# Patient Record
Sex: Male | Born: 1940
Health system: Southern US, Community
[De-identification: ages and names within clinical notes are randomized; demographics above are authoritative.]

## PROBLEM LIST (undated history)

## (undated) DIAGNOSIS — E785 Hyperlipidemia, unspecified: Secondary | ICD-10-CM

## (undated) DIAGNOSIS — C801 Malignant (primary) neoplasm, unspecified: Secondary | ICD-10-CM

## (undated) DIAGNOSIS — C61 Malignant neoplasm of prostate: Secondary | ICD-10-CM

## (undated) DIAGNOSIS — E119 Type 2 diabetes mellitus without complications: Secondary | ICD-10-CM

## (undated) HISTORY — DX: Malignant neoplasm of prostate: C61

## (undated) HISTORY — DX: Hyperlipidemia, unspecified: E78.5

## (undated) HISTORY — DX: Malignant (primary) neoplasm, unspecified: C80.1

## (undated) HISTORY — PX: ROTATOR CUFF REPAIR: SHX139

---

## 2004-02-14 ENCOUNTER — Ambulatory Visit (HOSPITAL_BASED_OUTPATIENT_CLINIC_OR_DEPARTMENT_OTHER): Admission: RE | Admit: 2004-02-14 | Discharge: 2004-02-14 | Payer: Self-pay | Admitting: Orthopedic Surgery

## 2004-02-14 ENCOUNTER — Ambulatory Visit (HOSPITAL_COMMUNITY): Admission: RE | Admit: 2004-02-14 | Discharge: 2004-02-14 | Payer: Self-pay | Admitting: Orthopedic Surgery

## 2012-03-06 DIAGNOSIS — M79609 Pain in unspecified limb: Secondary | ICD-10-CM | POA: Diagnosis not present

## 2012-03-10 DIAGNOSIS — I872 Venous insufficiency (chronic) (peripheral): Secondary | ICD-10-CM | POA: Diagnosis not present

## 2012-03-10 DIAGNOSIS — L0390 Cellulitis, unspecified: Secondary | ICD-10-CM | POA: Diagnosis not present

## 2012-03-10 DIAGNOSIS — L0291 Cutaneous abscess, unspecified: Secondary | ICD-10-CM | POA: Diagnosis not present

## 2013-01-13 DIAGNOSIS — L821 Other seborrheic keratosis: Secondary | ICD-10-CM | POA: Diagnosis not present

## 2013-01-13 DIAGNOSIS — L570 Actinic keratosis: Secondary | ICD-10-CM | POA: Diagnosis not present

## 2013-01-13 DIAGNOSIS — D235 Other benign neoplasm of skin of trunk: Secondary | ICD-10-CM | POA: Diagnosis not present

## 2013-04-15 ENCOUNTER — Encounter: Payer: Self-pay | Admitting: Family Medicine

## 2013-04-15 ENCOUNTER — Telehealth: Payer: Self-pay | Admitting: Family Medicine

## 2013-04-15 ENCOUNTER — Encounter (INDEPENDENT_AMBULATORY_CARE_PROVIDER_SITE_OTHER): Payer: Self-pay

## 2013-04-15 ENCOUNTER — Ambulatory Visit (INDEPENDENT_AMBULATORY_CARE_PROVIDER_SITE_OTHER): Payer: Medicare Other | Admitting: Family Medicine

## 2013-04-15 VITALS — HR 79 | Temp 98.4°F | Ht 69.0 in | Wt 190.8 lb

## 2013-04-15 DIAGNOSIS — M1990 Unspecified osteoarthritis, unspecified site: Secondary | ICD-10-CM

## 2013-04-15 NOTE — Progress Notes (Signed)
   Subjective:    Patient ID: Howard Ramos, male    DOB: [birth year], 72 y.o.   MRN: [id number]  HPI This 72 y.o. male presents for evaluation of needing DMV paper work filled out. He has no chronic medical problems except for some mild arthritis.  He has had A shoulder surgery 9 years ago.   Review of Systems No chest pain, SOB, HA, dizziness, vision change, N/V, diarrhea, constipation, dysuria, urinary urgency or frequency, myalgias, arthralgias or rash.     Objective:   Physical Exam  Vital signs noted  Well developed well nourished male.  HEENT - Head atraumatic Normocephalic                Eyes - PERRLA, Conjuctiva - clear Sclera- Clear EOMI                Ears - EAC's Wnl TM's Wnl Gross Hearing WNL                Nose - Nares patent                 Throat - oropharanx wnl Respiratory - Lungs CTA bilateral Cardiac - RRR S1 and S2 without murmur GI - Abdomen soft Nontender and bowel sounds active x 4 Extremities - No edema. Neuro - Grossly intact.      Assessment & Plan:  OA (osteoarthritis) Aleve otc and DMV paperwork filled out and no restrictions.  Deatra Canter FNP

## 2013-04-20 NOTE — Telephone Encounter (Signed)
Taken care of by Azalee Course

## 2013-04-20 NOTE — Telephone Encounter (Signed)
Need to know what papers he is talking about.

## 2013-08-20 DIAGNOSIS — H524 Presbyopia: Secondary | ICD-10-CM | POA: Diagnosis not present

## 2013-08-20 DIAGNOSIS — H5210 Myopia, unspecified eye: Secondary | ICD-10-CM | POA: Diagnosis not present

## 2013-08-20 DIAGNOSIS — H2510 Age-related nuclear cataract, unspecified eye: Secondary | ICD-10-CM | POA: Diagnosis not present

## 2013-08-20 DIAGNOSIS — H52229 Regular astigmatism, unspecified eye: Secondary | ICD-10-CM | POA: Diagnosis not present

## 2013-10-12 DIAGNOSIS — H40059 Ocular hypertension, unspecified eye: Secondary | ICD-10-CM | POA: Diagnosis not present

## 2013-10-12 DIAGNOSIS — H18519 Endothelial corneal dystrophy, unspecified eye: Secondary | ICD-10-CM | POA: Diagnosis not present

## 2013-10-12 DIAGNOSIS — H35379 Puckering of macula, unspecified eye: Secondary | ICD-10-CM | POA: Diagnosis not present

## 2013-10-12 DIAGNOSIS — H2510 Age-related nuclear cataract, unspecified eye: Secondary | ICD-10-CM | POA: Diagnosis not present

## 2013-10-13 ENCOUNTER — Encounter (HOSPITAL_COMMUNITY): Payer: Self-pay | Admitting: Pharmacy Technician

## 2013-10-19 DIAGNOSIS — H2510 Age-related nuclear cataract, unspecified eye: Secondary | ICD-10-CM | POA: Diagnosis not present

## 2013-10-23 ENCOUNTER — Encounter (HOSPITAL_COMMUNITY)
Admission: RE | Admit: 2013-10-23 | Discharge: 2013-10-23 | Disposition: A | Discharge status: Home / Self Care | Payer: Medicare Other | Source: Ambulatory Visit | Attending: Ophthalmology | Admitting: Ophthalmology

## 2013-10-23 ENCOUNTER — Encounter (HOSPITAL_COMMUNITY): Payer: Self-pay

## 2013-10-23 ENCOUNTER — Other Ambulatory Visit: Payer: Self-pay

## 2013-10-23 DIAGNOSIS — Z01812 Encounter for preprocedural laboratory examination: Secondary | ICD-10-CM | POA: Diagnosis not present

## 2013-10-23 DIAGNOSIS — Z01810 Encounter for preprocedural cardiovascular examination: Secondary | ICD-10-CM | POA: Insufficient documentation

## 2013-10-23 HISTORY — DX: Type 2 diabetes mellitus without complications: E11.9

## 2013-10-23 LAB — BASIC METABOLIC PANEL
BUN: 20 mg/dL (ref 6–23)
CALCIUM: 9.1 mg/dL (ref 8.4–10.5)
CO2: 26 mEq/L (ref 19–32)
CREATININE: 1.16 mg/dL (ref 0.50–1.35)
Chloride: 104 mEq/L (ref 96–112)
GFR calc Af Amer: 71 mL/min — ABNORMAL LOW (ref >90)
GFR calc non Af Amer: 61 mL/min — ABNORMAL LOW (ref >90)
Glucose, Bld: 78 mg/dL (ref 70–99)
Potassium: 4.5 mEq/L (ref 3.7–5.3)
Sodium: 141 mEq/L (ref 137–147)

## 2013-10-23 LAB — HEMOGLOBIN AND HEMATOCRIT, BLOOD
HEMATOCRIT: 43.1 % (ref 39.0–52.0)
Hemoglobin: 14.5 g/dL (ref 13.0–17.0)

## 2013-10-23 NOTE — Patient Instructions (Signed)
Howard Ramos  10/23/2013   Your procedure is scheduled on:  10/29/13  Report to [facility] at 1200 PM.  Call this number if you have problems the morning of surgery: [phone]   Remember:   Do not eat food or drink liquids after midnight.   Take these medicines the morning of surgery with A SIP OF WATER: none   Do not wear jewelry, make-up or nail polish.  Do not wear lotions, powders, or perfumes. You may wear deodorant.  Do not shave 48 hours prior to surgery. Men may shave face and neck.  Do not bring valuables to the hospital.  [facility] is not responsible                  for any belongings or valuables.               Contacts, dentures or bridgework may not be worn into surgery.  Leave suitcase in the car. After surgery it may be brought to your room.  For patients admitted to the hospital, discharge time is determined by your                treatment team.               Patients discharged the day of surgery will not be allowed to drive  home.  Name and phone number of your driver: family  Special Instructions: N/A   Please read over the following fact sheets that you were given: Anesthesia Post-op Instructions and Care and Recovery After Surgery   PATIENT INSTRUCTIONS POST-ANESTHESIA  IMMEDIATELY FOLLOWING SURGERY:  Do not drive or operate machinery for the first twenty four hours after surgery.  Do not make any important decisions for twenty four hours after surgery or while taking narcotic pain medications or sedatives.  If you develop intractable nausea and vomiting or a severe headache please notify your doctor immediately.  FOLLOW-UP:  Please make an appointment with your surgeon as instructed. You do not need to follow up with anesthesia unless specifically instructed to do so.  WOUND CARE INSTRUCTIONS (if applicable):  Keep a dry clean dressing on the anesthesia/puncture wound site if there is drainage.  Once the wound has quit draining you may leave it open to air.   Generally you should leave the bandage intact for twenty four hours unless there is drainage.  If the epidural site drains for more than 36-48 hours please call the anesthesia department.  QUESTIONS?:  Please feel free to call your physician or the hospital operator if you have any questions, and they will be happy to assist you.      Cataract Surgery  A cataract is a clouding of the lens of the eye. When a lens becomes cloudy, vision is reduced based on the degree and nature of the clouding. Surgery may be needed to improve vision. Surgery removes the cloudy lens and usually replaces it with a substitute lens (intraocular lens, IOL). LET YOUR EYE DOCTOR KNOW ABOUT:  Allergies to food or medicine.  Medicines taken including herbs, eyedrops, over-the-counter medicines, and creams.  Use of steroids (by mouth or creams).  Previous problems with anesthetics or numbing medicine.  History of bleeding problems or blood clots.  Previous surgery.  Other health problems, including diabetes and kidney problems.  Possibility of pregnancy, if this applies. RISKS AND COMPLICATIONS  Infection.  Inflammation of the eyeball (endophthalmitis) that can spread to both eyes (sympathetic ophthalmia).  Poor wound healing.  If an IOL is inserted, it can later fall out of proper position. This is very uncommon.  Clouding of the part of your eye that holds an IOL in place. This is called an "after-cataract." These are uncommon, but easily treated. BEFORE THE PROCEDURE  Do not eat or drink anything except small amounts of water for 8 to 12 before your surgery, or as directed by your caregiver.  Unless you are told otherwise, continue any eyedrops you have been prescribed.  Talk to your primary caregiver about all other medicines that you take (both prescription and non-prescription). In some cases, you may need to stop or change medicines near the time of your surgery. This is most important if you  are taking blood-thinning medicine.Do not stop medicines unless you are told to do so.  Arrange for someone to drive you to and from the procedure.  Do not put contact lenses in either eye on the day of your surgery. PROCEDURE There is more than one method for safely removing a cataract. Your doctor can explain the differences and help determine which is best for you. Phacoemulsification surgery is the most common form of cataract surgery.  An injection is given behind the eye or eyedrops are given to make this a painless procedure.  A small cut (incision) is made on the edge of the clear, dome-shaped surface that covers the front of the eye (cornea).  A tiny probe is painlessly inserted into the eye. This device gives off ultrasound waves that soften and break up the cloudy center of the lens. This makes it easier for the cloudy lens to be removed by suction.  An IOL may be implanted.  The normal lens of the eye is covered by a clear capsule. Part of that capsule is intentionally left in the eye to support the IOL.  Your surgeon may or may not use stitches to close the incision. There are other forms of cataract surgery that require a larger incision and stiches to close the eye. This approach is taken in cases where the doctor feels that the cataract cannot be easily removed using phacoemulsification. AFTER THE PROCEDURE  When an IOL is implanted, it does not need care. It becomes a permanent part of your eye and cannot be seen or felt.  Your doctor will schedule follow-up exams to check on your progress.  Review your other medicines with your doctor to see which can be resumed after surgery.  Use eyedrops or take medicine as prescribed by your doctor. Document Released: 04/05/2011 Document Revised: 07/09/2011 Document Reviewed: 04/05/2011 [facility] Patient Information 2015 [place], [place]. This information is not intended to replace advice given to you by your health care provider.  Make sure you discuss any questions you have with your health care provider.

## 2013-10-23 NOTE — Pre-Procedure Instructions (Signed)
Patient given information to sign up for my chart at home. 

## 2013-10-29 ENCOUNTER — Encounter (HOSPITAL_COMMUNITY): Payer: Medicare Other | Admitting: Anesthesiology

## 2013-10-29 ENCOUNTER — Encounter (HOSPITAL_COMMUNITY)
Admission: RE | Disposition: A | Discharge status: Home / Self Care | Payer: Self-pay | Source: Ambulatory Visit | Attending: Ophthalmology

## 2013-10-29 ENCOUNTER — Ambulatory Visit (HOSPITAL_COMMUNITY)
Admission: RE | Admit: 2013-10-29 | Discharge: 2013-10-29 | Disposition: A | Discharge status: Home / Self Care | Payer: Medicare Other | Source: Ambulatory Visit | Attending: Ophthalmology | Admitting: Ophthalmology

## 2013-10-29 ENCOUNTER — Encounter (HOSPITAL_COMMUNITY): Payer: Self-pay | Admitting: *Deleted

## 2013-10-29 ENCOUNTER — Ambulatory Visit (HOSPITAL_COMMUNITY): Payer: Medicare Other | Admitting: Anesthesiology

## 2013-10-29 DIAGNOSIS — H2510 Age-related nuclear cataract, unspecified eye: Secondary | ICD-10-CM | POA: Diagnosis not present

## 2013-10-29 DIAGNOSIS — Z7982 Long term (current) use of aspirin: Secondary | ICD-10-CM | POA: Diagnosis not present

## 2013-10-29 DIAGNOSIS — H269 Unspecified cataract: Secondary | ICD-10-CM | POA: Insufficient documentation

## 2013-10-29 DIAGNOSIS — E119 Type 2 diabetes mellitus without complications: Secondary | ICD-10-CM | POA: Insufficient documentation

## 2013-10-29 DIAGNOSIS — H259 Unspecified age-related cataract: Secondary | ICD-10-CM | POA: Diagnosis not present

## 2013-10-29 DIAGNOSIS — Z87891 Personal history of nicotine dependence: Secondary | ICD-10-CM | POA: Diagnosis not present

## 2013-10-29 HISTORY — PX: CATARACT EXTRACTION W/PHACO: SHX586

## 2013-10-29 LAB — GLUCOSE, CAPILLARY: Glucose-Capillary: 93 mg/dL (ref 70–99)

## 2013-10-29 SURGERY — PHACOEMULSIFICATION, CATARACT, WITH IOL INSERTION
Anesthesia: Monitor Anesthesia Care | Site: Eye | Laterality: Left

## 2013-10-29 MED ORDER — TETRACAINE HCL 0.5 % OP SOLN
OPHTHALMIC | Status: AC
  Filled 2013-10-29: qty 2

## 2013-10-29 MED ORDER — FENTANYL CITRATE 0.05 MG/ML IJ SOLN: 25.0000 ug | INTRAMUSCULAR | Status: DC | PRN

## 2013-10-29 MED ORDER — ONDANSETRON HCL 4 MG/2ML IJ SOLN: 4.0000 mg | Freq: Once | INTRAMUSCULAR | Status: DC | PRN

## 2013-10-29 MED ORDER — MIDAZOLAM HCL 2 MG/2ML IJ SOLN
INTRAMUSCULAR | Status: AC
  Filled 2013-10-29: qty 2

## 2013-10-29 MED ORDER — PHENYLEPHRINE HCL 2.5 % OP SOLN
1.0000 [drp] | OPHTHALMIC | Status: AC
  Administered 2013-10-29 (×3): 1 [drp] via OPHTHALMIC

## 2013-10-29 MED ORDER — FENTANYL CITRATE 0.05 MG/ML IJ SOLN
25.0000 ug | INTRAMUSCULAR | Status: AC
  Administered 2013-10-29 (×2): 25 ug via INTRAVENOUS

## 2013-10-29 MED ORDER — LIDOCAINE 3.5 % OP GEL OPTIME - NO CHARGE
OPHTHALMIC | Status: DC | PRN
  Administered 2013-10-29: 2 [drp] via OPHTHALMIC

## 2013-10-29 MED ORDER — LIDOCAINE HCL (PF) 1 % IJ SOLN
INTRAMUSCULAR | Status: AC
  Filled 2013-10-29: qty 2

## 2013-10-29 MED ORDER — CYCLOPENTOLATE-PHENYLEPHRINE 0.2-1 % OP SOLN
1.0000 [drp] | OPHTHALMIC | Status: AC
  Administered 2013-10-29 (×3): 1 [drp] via OPHTHALMIC

## 2013-10-29 MED ORDER — EPINEPHRINE HCL 1 MG/ML IJ SOLN
INTRAOCULAR | Status: DC | PRN
  Administered 2013-10-29: 13:00:00

## 2013-10-29 MED ORDER — NEOMYCIN-POLYMYXIN-DEXAMETH 3.5-10000-0.1 OP SUSP
OPHTHALMIC | Status: DC | PRN
  Administered 2013-10-29: 2 [drp] via OPHTHALMIC

## 2013-10-29 MED ORDER — TETRACAINE HCL 0.5 % OP SOLN
1.0000 [drp] | OPHTHALMIC | Status: AC
  Administered 2013-10-29 (×3): 1 [drp] via OPHTHALMIC

## 2013-10-29 MED ORDER — LACTATED RINGERS IV SOLN
INTRAVENOUS | Status: DC
  Administered 2013-10-29: 13:00:00 via INTRAVENOUS

## 2013-10-29 MED ORDER — FENTANYL CITRATE 0.05 MG/ML IJ SOLN
INTRAMUSCULAR | Status: AC
  Filled 2013-10-29: qty 2

## 2013-10-29 MED ORDER — NEOMYCIN-POLYMYXIN-DEXAMETH 3.5-10000-0.1 OP SUSP
OPHTHALMIC | Status: AC
  Filled 2013-10-29: qty 5

## 2013-10-29 MED ORDER — MIDAZOLAM HCL 2 MG/2ML IJ SOLN
1.0000 mg | INTRAMUSCULAR | Status: DC | PRN
  Administered 2013-10-29: 2 mg via INTRAVENOUS

## 2013-10-29 MED ORDER — POVIDONE-IODINE 5 % OP SOLN
OPHTHALMIC | Status: DC | PRN
  Administered 2013-10-29: 1 via OPHTHALMIC

## 2013-10-29 MED ORDER — CYCLOPENTOLATE-PHENYLEPHRINE OP SOLN OPTIME - NO CHARGE
OPHTHALMIC | Status: AC
  Filled 2013-10-29: qty 2

## 2013-10-29 MED ORDER — LIDOCAINE HCL (PF) 1 % IJ SOLN
INTRAMUSCULAR | Status: DC | PRN
  Administered 2013-10-29: .4 mL

## 2013-10-29 MED ORDER — LIDOCAINE HCL 3.5 % OP GEL
OPHTHALMIC | Status: AC
  Filled 2013-10-29: qty 1

## 2013-10-29 MED ORDER — LIDOCAINE HCL 3.5 % OP GEL
1.0000 "application " | Freq: Once | OPHTHALMIC | Status: AC
  Administered 2013-10-29: 1 via OPHTHALMIC

## 2013-10-29 MED ORDER — NA HYALUR & NA CHOND-NA HYALUR 0.55-0.5 ML IO KIT
PACK | INTRAOCULAR | Status: DC | PRN
  Administered 2013-10-29: 1 via OPHTHALMIC

## 2013-10-29 MED ORDER — BSS IO SOLN
INTRAOCULAR | Status: DC | PRN
  Administered 2013-10-29: 15 mL via INTRAOCULAR

## 2013-10-29 MED ORDER — PHENYLEPHRINE HCL 2.5 % OP SOLN
OPHTHALMIC | Status: AC
  Filled 2013-10-29: qty 15

## 2013-10-29 SURGICAL SUPPLY — 33 items
CAPSULAR TENSION RING-AMO (OPHTHALMIC RELATED) IMPLANT
CLOTH BEACON ORANGE TIMEOUT ST (SAFETY) ×2 IMPLANT
EYE SHIELD UNIVERSAL CLEAR (GAUZE/BANDAGES/DRESSINGS) ×2 IMPLANT
GLOVE BIO SURGEON STRL SZ 6.5 (GLOVE) ×1 IMPLANT
GLOVE BIO SURGEONS STRL SZ 6.5 (GLOVE) ×1
GLOVE BIOGEL PI IND STRL 6.5 (GLOVE) IMPLANT
GLOVE BIOGEL PI IND STRL 7.0 (GLOVE) IMPLANT
GLOVE BIOGEL PI IND STRL 7.5 (GLOVE) IMPLANT
GLOVE BIOGEL PI INDICATOR 6.5 (GLOVE)
GLOVE BIOGEL PI INDICATOR 7.0 (GLOVE)
GLOVE BIOGEL PI INDICATOR 7.5 (GLOVE)
GLOVE ECLIPSE 6.5 STRL STRAW (GLOVE) IMPLANT
GLOVE ECLIPSE 7.0 STRL STRAW (GLOVE) IMPLANT
GLOVE ECLIPSE 7.5 STRL STRAW (GLOVE) IMPLANT
GLOVE EXAM NITRILE LRG STRL (GLOVE) IMPLANT
GLOVE EXAM NITRILE MD LF STRL (GLOVE) IMPLANT
GLOVE SKINSENSE NS SZ6.5 (GLOVE)
GLOVE SKINSENSE NS SZ7.0 (GLOVE) ×2
GLOVE SKINSENSE STRL SZ6.5 (GLOVE) IMPLANT
GLOVE SKINSENSE STRL SZ7.0 (GLOVE) IMPLANT
KIT VITRECTOMY (OPHTHALMIC RELATED) IMPLANT
PAD ARMBOARD 7.5X6 YLW CONV (MISCELLANEOUS) ×2 IMPLANT
PROC W NO LENS (INTRAOCULAR LENS)
PROC W SPEC LENS (INTRAOCULAR LENS)
PROCESS W NO LENS (INTRAOCULAR LENS) IMPLANT
PROCESS W SPEC LENS (INTRAOCULAR LENS) IMPLANT
RING MALYGIN (MISCELLANEOUS) IMPLANT
SIGHTPATH CAT PROC W REG LENS (Ophthalmic Related) ×3 IMPLANT
SYR TB 1ML LL NO SAFETY (SYRINGE) ×3 IMPLANT
TAPE SURG TRANSPORE 1 IN (GAUZE/BANDAGES/DRESSINGS) IMPLANT
TAPE SURGICAL TRANSPORE 1 IN (GAUZE/BANDAGES/DRESSINGS) ×2
VISCOELASTIC ADDITIONAL (OPHTHALMIC RELATED) IMPLANT
WATER STERILE IRR 250ML POUR (IV SOLUTION) ×2 IMPLANT

## 2013-10-29 NOTE — Transfer of Care (Signed)
Immediate Anesthesia Transfer of Care Note  Patient: Howard Ramos  Procedure(s) Performed: Procedure(s) with comments: CATARACT EXTRACTION PHACO AND INTRAOCULAR LENS PLACEMENT (IOC) (Left) - CDE:  15.52  Patient Location: Short Stay  Anesthesia Type:MAC  Level of Consciousness: awake, alert , oriented and patient cooperative  Airway & Oxygen Therapy: Patient Spontanous Breathing  Post-op Assessment: Report given to PACU RN, Post -op Vital signs reviewed and stable and Patient moving all extremities  Post vital signs: Reviewed and stable  Complications: No apparent anesthesia complications

## 2013-10-29 NOTE — Consult Note (Signed)
I have reviewed the H&P, the patient was re-examined, and I have identified no interval changes in medical condition and plan of care since the history and physical of record  

## 2013-10-29 NOTE — Anesthesia Procedure Notes (Signed)
Procedure Name: MAC Date/Time: 10/29/2013 1:09 PM Performed by: Vista Deck Pre-anesthesia Checklist: Patient identified, Emergency Drugs available, Suction available, Timeout performed and Patient being monitored Patient Re-evaluated:Patient Re-evaluated prior to inductionOxygen Delivery Method: Nasal Cannula

## 2013-10-29 NOTE — Anesthesia Preprocedure Evaluation (Signed)
Anesthesia Evaluation  Patient identified by MRN, date of birth, ID band Patient awake    Reviewed: Allergy & Precautions, H&P , NPO status , Patient's Chart, lab work & pertinent test results  Airway Mallampati: II TM Distance: >3 FB     Dental  (+) Teeth Intact   Pulmonary former smoker,  breath sounds clear to auscultation        Cardiovascular Rhythm:Regular Rate:Normal     Neuro/Psych    GI/Hepatic negative GI ROS,   Endo/Other  diabetes, Well Controlled, Type 2  Renal/GU      Musculoskeletal   Abdominal   Peds  Hematology   Anesthesia Other Findings   Reproductive/Obstetrics                           Anesthesia Physical Anesthesia Plan  ASA: II  Anesthesia Plan: MAC   Post-op Pain Management:    Induction: Intravenous  Airway Management Planned: Nasal Cannula  Additional Equipment:   Intra-op Plan:   Post-operative Plan:   Informed Consent: I have reviewed the patients History and Physical, chart, labs and discussed the procedure including the risks, benefits and alternatives for the proposed anesthesia with the patient or authorized representative who has indicated his/her understanding and acceptance.     Plan Discussed with:   Anesthesia Plan Comments:         Anesthesia Quick Evaluation

## 2013-10-29 NOTE — Op Note (Signed)
Date of Admission: 10/29/2013  Date of Surgery: 10/29/2013   Pre-Op Dx: Cataract Left Eye  Post-Op Dx: Cataract Left  Eye,  Dx Code 366.16  Surgeon: Tonny Branch, M.D.  Assistants: None  Anesthesia: Topical with MAC  Indications: Painless, progressive loss of vision with compromise of daily activities.  Surgery: Cataract Extraction with Intraocular lens Implant Left Eye  Discription: The patient had dilating drops and viscous lidocaine placed into the Left eye in the pre-op holding area. After transfer to the operating room, a time out was performed. The patient was then prepped and draped. Beginning with a 46 degree blade a paracentesis port was made at the surgeon's 2 o'clock position. The anterior chamber was then filled with 1% non-preserved lidocaine. This was followed by filling the anterior chamber with Provisc.  A 2.46mm keratome blade was used to make a clear corneal incision at the temporal limbus.  A bent cystatome needle was used to create a continuous tear capsulotomy. Hydrodissection was performed with balanced salt solution on a Fine canula. The lens nucleus was then removed using the phacoemulsification handpiece. Residual cortex was removed with the I&A handpiece. The anterior chamber and capsular bag were refilled with Provisc. A posterior chamber intraocular lens was placed into the capsular bag with it's injector. The implant was positioned with the Kuglan hook. The Provisc was then removed from the anterior chamber and capsular bag with the I&A handpiece. Stromal hydration of the main incision and paracentesis port was performed with BSS on a Fine canula. The wounds were tested for leak which was negative. The patient tolerated the procedure well. There were no operative complications. The patient was then transferred to the recovery room in stable condition.  Complications: None  Specimen: None  EBL: None  Prosthetic device: Hoya iSert 250, power 22.0 D, SN [id number].

## 2013-10-29 NOTE — Anesthesia Postprocedure Evaluation (Signed)
  Anesthesia Post-op Note  Patient: Howard Ramos  Procedure(s) Performed: Procedure(s) with comments: CATARACT EXTRACTION PHACO AND INTRAOCULAR LENS PLACEMENT (IOC) (Left) - CDE:  15.52  Patient Location: Short Stay  Anesthesia Type:MAC  Level of Consciousness: awake, alert , oriented and patient cooperative  Airway and Oxygen Therapy: Patient Spontanous Breathing  Post-op Pain: none  Post-op Assessment: Post-op Vital signs reviewed, Patient's Cardiovascular Status Stable, Respiratory Function Stable, Patent Airway and Pain level controlled  Post-op Vital Signs: Reviewed and stable  Last Vitals:  Filed Vitals:   10/29/13 1250  BP: 115/75  Temp:   Resp: 14    Complications: No apparent anesthesia complications

## 2013-11-02 ENCOUNTER — Encounter (HOSPITAL_COMMUNITY): Payer: Self-pay | Admitting: Ophthalmology

## 2013-11-02 DIAGNOSIS — H18519 Endothelial corneal dystrophy, unspecified eye: Secondary | ICD-10-CM | POA: Diagnosis not present

## 2013-11-02 DIAGNOSIS — H2510 Age-related nuclear cataract, unspecified eye: Secondary | ICD-10-CM | POA: Diagnosis not present

## 2013-11-18 ENCOUNTER — Other Ambulatory Visit (HOSPITAL_COMMUNITY): Payer: Medicare Other

## 2013-11-19 ENCOUNTER — Encounter (HOSPITAL_COMMUNITY): Payer: Self-pay | Admitting: Pharmacy Technician

## 2013-11-30 DIAGNOSIS — H2510 Age-related nuclear cataract, unspecified eye: Secondary | ICD-10-CM | POA: Diagnosis not present

## 2013-12-01 ENCOUNTER — Encounter (HOSPITAL_COMMUNITY)
Admission: RE | Admit: 2013-12-01 | Discharge: 2013-12-01 | Disposition: A | Discharge status: Home / Self Care | Payer: Medicare Other | Source: Ambulatory Visit | Attending: Ophthalmology | Admitting: Ophthalmology

## 2013-12-04 MED ORDER — LIDOCAINE HCL 3.5 % OP GEL
OPHTHALMIC | Status: AC
  Filled 2013-12-04: qty 1

## 2013-12-04 MED ORDER — NEOMYCIN-POLYMYXIN-DEXAMETH 3.5-10000-0.1 OP SUSP
OPHTHALMIC | Status: AC
  Filled 2013-12-04: qty 5

## 2013-12-04 MED ORDER — TETRACAINE HCL 0.5 % OP SOLN
OPHTHALMIC | Status: AC
  Filled 2013-12-04: qty 2

## 2013-12-04 MED ORDER — PHENYLEPHRINE HCL 2.5 % OP SOLN
OPHTHALMIC | Status: AC
  Filled 2013-12-04: qty 15

## 2013-12-04 MED ORDER — CYCLOPENTOLATE-PHENYLEPHRINE OP SOLN OPTIME - NO CHARGE
OPHTHALMIC | Status: AC
  Filled 2013-12-04: qty 2

## 2013-12-04 MED ORDER — LIDOCAINE HCL (PF) 1 % IJ SOLN
INTRAMUSCULAR | Status: AC
  Filled 2013-12-04: qty 2

## 2013-12-07 ENCOUNTER — Encounter (HOSPITAL_COMMUNITY): Payer: Self-pay | Admitting: *Deleted

## 2013-12-07 ENCOUNTER — Ambulatory Visit (HOSPITAL_COMMUNITY)
Admission: RE | Admit: 2013-12-07 | Discharge: 2013-12-07 | Disposition: A | Discharge status: Home / Self Care | Payer: Medicare Other | Source: Ambulatory Visit | Attending: Ophthalmology | Admitting: Ophthalmology

## 2013-12-07 ENCOUNTER — Encounter (HOSPITAL_COMMUNITY)
Admission: RE | Disposition: A | Discharge status: Home / Self Care | Payer: Self-pay | Source: Ambulatory Visit | Attending: Ophthalmology

## 2013-12-07 ENCOUNTER — Encounter (HOSPITAL_COMMUNITY): Payer: Medicare Other | Admitting: Anesthesiology

## 2013-12-07 ENCOUNTER — Ambulatory Visit (HOSPITAL_COMMUNITY): Payer: Medicare Other | Admitting: Anesthesiology

## 2013-12-07 DIAGNOSIS — H2510 Age-related nuclear cataract, unspecified eye: Secondary | ICD-10-CM | POA: Insufficient documentation

## 2013-12-07 DIAGNOSIS — H269 Unspecified cataract: Secondary | ICD-10-CM | POA: Diagnosis not present

## 2013-12-07 HISTORY — PX: CATARACT EXTRACTION W/PHACO: SHX586

## 2013-12-07 LAB — GLUCOSE, CAPILLARY: GLUCOSE-CAPILLARY: 112 mg/dL — AB (ref 70–99)

## 2013-12-07 SURGERY — PHACOEMULSIFICATION, CATARACT, WITH IOL INSERTION
Anesthesia: Monitor Anesthesia Care | Site: Eye | Laterality: Right

## 2013-12-07 MED ORDER — CYCLOPENTOLATE-PHENYLEPHRINE 0.2-1 % OP SOLN
1.0000 [drp] | OPHTHALMIC | Status: AC
  Administered 2013-12-07 (×3): 1 [drp] via OPHTHALMIC

## 2013-12-07 MED ORDER — FENTANYL CITRATE 0.05 MG/ML IJ SOLN
25.0000 ug | INTRAMUSCULAR | Status: AC
Start: 2013-12-07 — End: 2013-12-07
  Administered 2013-12-07 (×2): 25 ug via INTRAVENOUS

## 2013-12-07 MED ORDER — LACTATED RINGERS IV SOLN
INTRAVENOUS | Status: DC
  Administered 2013-12-07: 10:00:00 via INTRAVENOUS

## 2013-12-07 MED ORDER — MIDAZOLAM HCL 2 MG/2ML IJ SOLN
INTRAMUSCULAR | Status: AC
  Filled 2013-12-07: qty 2

## 2013-12-07 MED ORDER — EPINEPHRINE HCL 1 MG/ML IJ SOLN
INTRAOCULAR | Status: DC | PRN
  Administered 2013-12-07: 10:00:00

## 2013-12-07 MED ORDER — LIDOCAINE HCL 3.5 % OP GEL
1.0000 "application " | Freq: Once | OPHTHALMIC | Status: AC
  Administered 2013-12-07: 1 via OPHTHALMIC

## 2013-12-07 MED ORDER — POVIDONE-IODINE 5 % OP SOLN
OPHTHALMIC | Status: DC | PRN
  Administered 2013-12-07: 1 via OPHTHALMIC

## 2013-12-07 MED ORDER — MIDAZOLAM HCL 2 MG/2ML IJ SOLN
1.0000 mg | INTRAMUSCULAR | Status: DC | PRN
  Administered 2013-12-07: 2 mg via INTRAVENOUS

## 2013-12-07 MED ORDER — NEOMYCIN-POLYMYXIN-DEXAMETH 3.5-10000-0.1 OP SUSP
OPHTHALMIC | Status: DC | PRN
  Administered 2013-12-07: 2 [drp] via OPHTHALMIC

## 2013-12-07 MED ORDER — PHENYLEPHRINE HCL 2.5 % OP SOLN
1.0000 [drp] | OPHTHALMIC | Status: AC
  Administered 2013-12-07 (×3): 1 [drp] via OPHTHALMIC

## 2013-12-07 MED ORDER — NA HYALUR & NA CHOND-NA HYALUR 0.55-0.5 ML IO KIT
PACK | INTRAOCULAR | Status: DC | PRN
  Administered 2013-12-07: 1 via OPHTHALMIC

## 2013-12-07 MED ORDER — LIDOCAINE HCL (PF) 1 % IJ SOLN
INTRAMUSCULAR | Status: DC | PRN
  Administered 2013-12-07: .5 mL

## 2013-12-07 MED ORDER — EPINEPHRINE HCL 1 MG/ML IJ SOLN
INTRAMUSCULAR | Status: AC
  Filled 2013-12-07: qty 1

## 2013-12-07 MED ORDER — TETRACAINE HCL 0.5 % OP SOLN
1.0000 [drp] | OPHTHALMIC | Status: AC
  Administered 2013-12-07 (×3): 1 [drp] via OPHTHALMIC

## 2013-12-07 MED ORDER — FENTANYL CITRATE 0.05 MG/ML IJ SOLN
INTRAMUSCULAR | Status: AC
  Filled 2013-12-07: qty 2

## 2013-12-07 MED ORDER — BSS IO SOLN
INTRAOCULAR | Status: DC | PRN
  Administered 2013-12-07: 15 mL

## 2013-12-07 SURGICAL SUPPLY — 34 items
CAPSULAR TENSION RING-AMO (OPHTHALMIC RELATED) IMPLANT
CLOTH BEACON ORANGE TIMEOUT ST (SAFETY) ×3 IMPLANT
EYE SHIELD UNIVERSAL CLEAR (GAUZE/BANDAGES/DRESSINGS) ×2 IMPLANT
GLOVE BIO SURGEON STRL SZ 6.5 (GLOVE) IMPLANT
GLOVE BIO SURGEONS STRL SZ 6.5 (GLOVE)
GLOVE BIOGEL PI IND STRL 6.5 (GLOVE) IMPLANT
GLOVE BIOGEL PI IND STRL 7.0 (GLOVE) IMPLANT
GLOVE BIOGEL PI IND STRL 7.5 (GLOVE) IMPLANT
GLOVE BIOGEL PI INDICATOR 6.5 (GLOVE) ×2
GLOVE BIOGEL PI INDICATOR 7.0 (GLOVE)
GLOVE BIOGEL PI INDICATOR 7.5 (GLOVE)
GLOVE ECLIPSE 6.5 STRL STRAW (GLOVE) IMPLANT
GLOVE ECLIPSE 7.0 STRL STRAW (GLOVE) IMPLANT
GLOVE ECLIPSE 7.5 STRL STRAW (GLOVE) IMPLANT
GLOVE EXAM NITRILE LRG STRL (GLOVE) IMPLANT
GLOVE EXAM NITRILE MD LF STRL (GLOVE) ×2 IMPLANT
GLOVE SKINSENSE NS SZ6.5 (GLOVE)
GLOVE SKINSENSE NS SZ7.0 (GLOVE)
GLOVE SKINSENSE STRL SZ6.5 (GLOVE) IMPLANT
GLOVE SKINSENSE STRL SZ7.0 (GLOVE) IMPLANT
KIT VITRECTOMY (OPHTHALMIC RELATED) IMPLANT
PAD ARMBOARD 7.5X6 YLW CONV (MISCELLANEOUS) ×3 IMPLANT
PROC W NO LENS (INTRAOCULAR LENS)
PROC W SPEC LENS (INTRAOCULAR LENS)
PROCESS W NO LENS (INTRAOCULAR LENS) IMPLANT
PROCESS W SPEC LENS (INTRAOCULAR LENS) IMPLANT
RETRACTOR IRIS SIGHTPATH (OPHTHALMIC RELATED) ×1 IMPLANT
RING MALYGIN (MISCELLANEOUS) IMPLANT
SIGHTPATH CAT PROC W REG LENS (Ophthalmic Related) ×3 IMPLANT
SYRINGE LUER LOK 1CC (MISCELLANEOUS) ×3 IMPLANT
TAPE SURG TRANSPORE 1 IN (GAUZE/BANDAGES/DRESSINGS) IMPLANT
TAPE SURGICAL TRANSPORE 1 IN (GAUZE/BANDAGES/DRESSINGS) ×2
VISCOELASTIC ADDITIONAL (OPHTHALMIC RELATED) ×2 IMPLANT
WATER STERILE IRR 250ML POUR (IV SOLUTION) ×2 IMPLANT

## 2013-12-07 NOTE — Anesthesia Preprocedure Evaluation (Signed)
Anesthesia Evaluation  Patient identified by MRN, date of birth, ID band Patient awake    Reviewed: Allergy & Precautions, H&P , NPO status , Patient's Chart, lab work & pertinent test results  Airway Mallampati: II TM Distance: >3 FB     Dental  (+) Teeth Intact   Pulmonary former smoker,  breath sounds clear to auscultation        Cardiovascular Rhythm:Regular Rate:Normal     Neuro/Psych    GI/Hepatic negative GI ROS,   Endo/Other  diabetes, Well Controlled, Type 2  Renal/GU      Musculoskeletal   Abdominal   Peds  Hematology   Anesthesia Other Findings   Reproductive/Obstetrics                           Anesthesia Physical Anesthesia Plan  ASA: II  Anesthesia Plan: MAC   Post-op Pain Management:    Induction: Intravenous  Airway Management Planned: Nasal Cannula  Additional Equipment:   Intra-op Plan:   Post-operative Plan:   Informed Consent: I have reviewed the patients History and Physical, chart, labs and discussed the procedure including the risks, benefits and alternatives for the proposed anesthesia with the patient or authorized representative who has indicated his/her understanding and acceptance.     Plan Discussed with:   Anesthesia Plan Comments:         Anesthesia Quick Evaluation

## 2013-12-07 NOTE — Discharge Instructions (Signed)

## 2013-12-07 NOTE — H&P (Signed)
I have reviewed the H&P, the patient was re-examined, and I have identified no interval changes in medical condition and plan of care since the history and physical of record  

## 2013-12-07 NOTE — Op Note (Signed)
Date of Admission: 12/07/2013  Date of Surgery: 12/07/2013   Pre-Op Dx: Cataract Right Eye  Post-Op Dx: Senile Nuclear  Cataract Right  Eye,  Dx Code 366.16  Surgeon: Tonny Branch, M.D.  Assistants: None  Anesthesia: Topical with MAC  Indications: Painless, progressive loss of vision with compromise of daily activities.  Surgery: Cataract Extraction with Intraocular lens Implant Right Eye  Discription: The patient had dilating drops and viscous lidocaine placed into the Right eye in the pre-op holding area. After transfer to the operating room, a time out was performed. The patient was then prepped and draped. Beginning with a 48 degree blade a paracentesis port was made at the surgeon's 2 o'clock position. The anterior chamber was then filled with 1% non-preserved lidocaine. This was followed by filling the anterior chamber with Provisc.  A 2.95mm keratome blade was used to make a clear corneal incision at the temporal limbus.  A bent cystatome needle was used to create a continuous tear capsulotomy. Hydrodissection was performed with balanced salt solution on a Fine canula. The lens nucleus was then removed using the phacoemulsification handpiece. Residual cortex was removed with the I&A handpiece. The anterior chamber and capsular bag were refilled with Provisc. A posterior chamber intraocular lens was placed into the capsular bag with it's injector. The implant was positioned with the Kuglan hook. The Provisc was then removed from the anterior chamber and capsular bag with the I&A handpiece. Stromal hydration of the main incision and paracentesis port was performed with BSS on a Fine canula. The wounds were tested for leak which was negative. The patient tolerated the procedure well. There were no operative complications. The patient was then transferred to the recovery room in stable condition.  Complications: None  Specimen: None  EBL: None  Prosthetic device: Hoya iSert 250, power 21.5 D,  SN [id number].

## 2013-12-07 NOTE — Anesthesia Postprocedure Evaluation (Signed)
  Anesthesia Post-op Note  Patient: Howard Ramos  Procedure(s) Performed: Procedure(s) with comments: CATARACT EXTRACTION PHACO AND INTRAOCULAR LENS PLACEMENT (IOC) (Right) - CDE 16.99  Patient Location: Short Stay  Anesthesia Type:MAC  Level of Consciousness: awake, alert  and oriented  Airway and Oxygen Therapy: Patient Spontanous Breathing  Post-op Pain: none  Post-op Assessment: Post-op Vital signs reviewed, Patient's Cardiovascular Status Stable, Respiratory Function Stable, Patent Airway and No signs of Nausea or vomiting  Post-op Vital Signs: Reviewed and stable  Last Vitals:  Filed Vitals:   12/07/13 0859  BP: 120/67  Pulse: 61  Temp:   Resp: 18    Complications: No apparent anesthesia complications

## 2013-12-07 NOTE — Transfer of Care (Signed)
Immediate Anesthesia Transfer of Care Note  Patient: Howard Ramos  Procedure(s) Performed: Procedure(s) with comments: CATARACT EXTRACTION PHACO AND INTRAOCULAR LENS PLACEMENT (IOC) (Right) - CDE 16.99  Patient Location: Short Stay  Anesthesia Type:MAC  Level of Consciousness: awake  Airway & Oxygen Therapy: Patient Spontanous Breathing  Post-op Assessment: Report given to PACU RN  Post vital signs: Reviewed  Complications: No apparent anesthesia complications

## 2013-12-09 ENCOUNTER — Encounter (HOSPITAL_COMMUNITY): Payer: Self-pay | Admitting: Ophthalmology

## 2013-12-31 DIAGNOSIS — H35379 Puckering of macula, unspecified eye: Secondary | ICD-10-CM | POA: Diagnosis not present

## 2013-12-31 DIAGNOSIS — H43819 Vitreous degeneration, unspecified eye: Secondary | ICD-10-CM | POA: Diagnosis not present

## 2014-01-13 DIAGNOSIS — H5460 Unqualified visual loss, one eye, unspecified: Secondary | ICD-10-CM | POA: Diagnosis not present

## 2014-01-13 DIAGNOSIS — H35349 Macular cyst, hole, or pseudohole, unspecified eye: Secondary | ICD-10-CM | POA: Diagnosis not present

## 2014-01-13 DIAGNOSIS — H35379 Puckering of macula, unspecified eye: Secondary | ICD-10-CM | POA: Diagnosis not present

## 2014-01-20 DIAGNOSIS — H35379 Puckering of macula, unspecified eye: Secondary | ICD-10-CM | POA: Diagnosis not present

## 2014-03-10 DIAGNOSIS — H35372 Puckering of macula, left eye: Secondary | ICD-10-CM | POA: Diagnosis not present

## 2014-06-08 DIAGNOSIS — M609 Myositis, unspecified: Secondary | ICD-10-CM | POA: Diagnosis not present

## 2015-08-31 DIAGNOSIS — L821 Other seborrheic keratosis: Secondary | ICD-10-CM | POA: Diagnosis not present

## 2015-08-31 DIAGNOSIS — L723 Sebaceous cyst: Secondary | ICD-10-CM | POA: Diagnosis not present

## 2015-12-05 DIAGNOSIS — Z961 Presence of intraocular lens: Secondary | ICD-10-CM | POA: Diagnosis not present

## 2015-12-05 DIAGNOSIS — D3131 Benign neoplasm of right choroid: Secondary | ICD-10-CM | POA: Diagnosis not present

## 2015-12-05 DIAGNOSIS — D3130 Benign neoplasm of unspecified choroid: Secondary | ICD-10-CM | POA: Diagnosis not present

## 2016-01-17 ENCOUNTER — Ambulatory Visit (INDEPENDENT_AMBULATORY_CARE_PROVIDER_SITE_OTHER): Payer: Medicare Other | Admitting: Urology

## 2016-01-17 DIAGNOSIS — R972 Elevated prostate specific antigen [PSA]: Secondary | ICD-10-CM | POA: Diagnosis not present

## 2016-02-20 DIAGNOSIS — R972 Elevated prostate specific antigen [PSA]: Secondary | ICD-10-CM | POA: Diagnosis not present

## 2016-07-04 DIAGNOSIS — R972 Elevated prostate specific antigen [PSA]: Secondary | ICD-10-CM | POA: Diagnosis not present

## 2016-07-10 ENCOUNTER — Ambulatory Visit: Payer: Medicare Other | Admitting: Urology

## 2016-08-14 ENCOUNTER — Other Ambulatory Visit: Payer: Self-pay | Admitting: Urology

## 2016-08-14 ENCOUNTER — Ambulatory Visit (INDEPENDENT_AMBULATORY_CARE_PROVIDER_SITE_OTHER): Payer: Medicare Other | Admitting: Urology

## 2016-08-14 DIAGNOSIS — R972 Elevated prostate specific antigen [PSA]: Secondary | ICD-10-CM | POA: Diagnosis not present

## 2016-08-14 DIAGNOSIS — N401 Enlarged prostate with lower urinary tract symptoms: Secondary | ICD-10-CM | POA: Diagnosis not present

## 2016-09-04 ENCOUNTER — Ambulatory Visit (HOSPITAL_COMMUNITY)
Admission: RE | Admit: 2016-09-04 | Discharge: 2016-09-04 | Disposition: A | Discharge status: Home / Self Care | Payer: Medicare Other | Source: Ambulatory Visit | Attending: Urology | Admitting: Urology

## 2016-09-04 DIAGNOSIS — C61 Malignant neoplasm of prostate: Secondary | ICD-10-CM | POA: Diagnosis not present

## 2016-09-04 DIAGNOSIS — R972 Elevated prostate specific antigen [PSA]: Secondary | ICD-10-CM

## 2016-09-04 MED ORDER — GENTAMICIN SULFATE 40 MG/ML IJ SOLN
INTRAMUSCULAR | Status: AC
  Administered 2016-09-04: 160 mg via INTRAMUSCULAR
  Filled 2016-09-04: qty 4

## 2016-09-04 MED ORDER — GENTAMICIN SULFATE 40 MG/ML IJ SOLN
160.0000 mg | Freq: Once | INTRAMUSCULAR | Status: AC
Start: 2016-09-04 — End: 2016-09-04
  Administered 2016-09-04: 160 mg via INTRAMUSCULAR

## 2016-09-04 MED ORDER — LIDOCAINE HCL (PF) 2 % IJ SOLN
INTRAMUSCULAR | Status: AC
  Administered 2016-09-04: 10 mL
  Filled 2016-09-04: qty 10

## 2016-09-04 NOTE — Discharge Instructions (Signed)
Transrectal Ultrasound-Guided Biopsy °A transrectal ultrasound-guided biopsy is a procedure to remove samples of tissue from your prostate using ultrasound images to guide the procedure. The procedure is usually done to evaluate the prostate gland of men who have an elevated prostate-specific antigen (PSA). PSA is a blood test to screen for prostate cancer. The biopsy samples are taken to check for prostate cancer. °Tell a health care provider about: °· Any allergies you have. °· All medicines you are taking, including vitamins, herbs, eye drops, creams, and over-the-counter medicines. °· Any problems you or family members have had with anesthetic medicines. °· Any blood disorders you have. °· Any surgeries you have had. °· Any medical conditions you have. °What are the risks? °Generally, this is a safe procedure. However, as with any procedure, problems can occur. Possible problems include: °· Infection of your prostate. °· Bleeding from your rectum or blood in your urine. °· Difficulty urinating. °· Nerve damage (this is usually temporary). °· Damage to surrounding structures such as blood vessels, organs, and muscles, which would require other procedures. °What happens before the procedure? °· Do not eat or drink anything after midnight on the night before the procedure or as directed by your health care provider. °· Take medicines only as directed by your health care provider. °· Your health care provider may have you stop taking certain medicines 5-7 days before the procedure. °· You will be given an enema before the procedure. During an enema, a liquid is injected into your rectum to clear out waste. °· You may have lab tests the day of your procedure. °· Plan to have someone take you home after the procedure. °What happens during the procedure? °· You will be given medicine to help you relax (sedative) before the procedure. An IV tube will be inserted into one of your veins and used to give fluids and  medicine. °· You will be given antibiotic medicine to reduce the risk of an infection. °· You will be placed on your side for the procedure. °· A probe with lubricated gel will be placed into your rectum, and images will be taken of your prostate and surrounding structures. °· Numbing medicine will be injected into the prostate before the biopsy samples are taken. °· A biopsy needle will then be inserted and guided to your prostate with the use of the ultrasound images. °· Samples of prostate tissue will be taken, and the needle will then be removed. °· The biopsy samples will be sent to a lab to be analyzed. Results are usually back in 2-3 days. °What happens after the procedure? °· You will be taken to a recovery area where you will be monitored. °· You may have some discomfort in the rectal area. You will be given pain medicines to control this. °· You may be allowed to go home the same day, or you may need to stay in the hospital overnight. °This information is not intended to replace advice given to you by your health care provider. Make sure you discuss any questions you have with your health care provider. °Document Released: 08/31/2013 Document Revised: 09/22/2015 Document Reviewed: 12/03/2012 °Elsevier Interactive Patient Education © 2017 [employer] ° °

## 2016-09-26 DIAGNOSIS — D3130 Benign neoplasm of unspecified choroid: Secondary | ICD-10-CM | POA: Diagnosis not present

## 2016-09-26 DIAGNOSIS — H5203 Hypermetropia, bilateral: Secondary | ICD-10-CM | POA: Diagnosis not present

## 2016-09-26 DIAGNOSIS — H26492 Other secondary cataract, left eye: Secondary | ICD-10-CM | POA: Diagnosis not present

## 2016-09-26 DIAGNOSIS — H43812 Vitreous degeneration, left eye: Secondary | ICD-10-CM | POA: Diagnosis not present

## 2016-09-26 DIAGNOSIS — Z961 Presence of intraocular lens: Secondary | ICD-10-CM | POA: Diagnosis not present

## 2016-10-09 ENCOUNTER — Ambulatory Visit (INDEPENDENT_AMBULATORY_CARE_PROVIDER_SITE_OTHER): Payer: Medicare Other | Admitting: Urology

## 2016-10-09 DIAGNOSIS — C61 Malignant neoplasm of prostate: Secondary | ICD-10-CM

## 2017-04-01 DIAGNOSIS — C61 Malignant neoplasm of prostate: Secondary | ICD-10-CM | POA: Diagnosis not present

## 2017-04-09 ENCOUNTER — Ambulatory Visit: Payer: Medicare Other | Admitting: Urology

## 2017-05-16 ENCOUNTER — Encounter: Payer: Self-pay | Admitting: Family

## 2017-05-16 ENCOUNTER — Ambulatory Visit (INDEPENDENT_AMBULATORY_CARE_PROVIDER_SITE_OTHER): Payer: Medicare Other | Admitting: Family

## 2017-05-16 VITALS — BP 146/79 | HR 68 | Temp 97.1°F | Ht 70.0 in | Wt 203.4 lb

## 2017-05-16 DIAGNOSIS — Z87891 Personal history of nicotine dependence: Secondary | ICD-10-CM | POA: Diagnosis not present

## 2017-05-16 DIAGNOSIS — E663 Overweight: Secondary | ICD-10-CM

## 2017-05-16 DIAGNOSIS — E041 Nontoxic single thyroid nodule: Secondary | ICD-10-CM | POA: Insufficient documentation

## 2017-05-16 NOTE — Progress Notes (Signed)
Subjective:    Patient ID: Howard Ramos, male    DOB: July 09, 1940, 77 y.o.   MRN: [id number]   HPI Pt presents to the office today to establish care. PT states he goes to the [place] regularly. PT states he was getting a low dose CT scan and found he had two nodules on his thyroid. Pt states he is unsure what he needs to do. Pt states he has cancer of his prostate and does worry him.   Pt states he had an Ultrasound in 2017 and in 2018 and the nodule became larger. Denies any SOB, swelling, or dysphagia.     Review of Systems  All other systems reviewed and are negative.  Family History  Problem Relation Age of Onset  . Hypertension Mother   . Cancer Father        Pancreatic     Social History   Socioeconomic History  . Marital status: Married    Spouse name: None  . Number of children: None  . Years of education: None  . Highest education level: None  Social Needs  . Financial resource strain: None  . Food insecurity - worry: None  . Food insecurity - inability: None  . Transportation needs - medical: None  . Transportation needs - non-medical: None  Occupational History  . None  Tobacco Use  . Smoking status: Former Smoker    Packs/day: 1.00    Years: 40.00    Pack years: 40.00    Types: Cigarettes    Last attempt to quit: 10/24/2007    Years since quitting: 9.5  . Smokeless tobacco: Never Used  Substance and Sexual Activity  . Alcohol use: No  . Drug use: No  . Sexual activity: Yes    Birth control/protection: None  Other Topics Concern  . None  Social History Narrative  . None        Objective:   Physical Exam  Constitutional: He is oriented to person, place, and time. He appears well-developed and well-nourished. No distress.  HENT:  Head: Normocephalic.  Right Ear: External ear normal.  Left Ear: External ear normal.  Mouth/Throat: Oropharynx is clear and moist.  Eyes: Pupils are equal, round, and reactive to light. Right eye exhibits no discharge.  Left eye exhibits no discharge.  Neck: Normal range of motion. Neck supple. No thyromegaly present.  Cardiovascular: Normal rate, regular rhythm, normal heart sounds and intact distal pulses.  No murmur heard. Pulmonary/Chest: Effort normal and breath sounds normal. No respiratory distress. He has no wheezes.  Abdominal: Soft. Bowel sounds are normal. He exhibits no distension. There is no tenderness.  Musculoskeletal: Normal range of motion. He exhibits no edema or tenderness.  Neurological: He is alert and oriented to person, place, and time.  Skin: Skin is warm and dry. No rash noted. No erythema.  Psychiatric: He has a normal mood and affect. His behavior is normal. Judgment and thought content normal.  Vitals reviewed.     BP (!) 146/79   Pulse 68   Temp (!) 97.1 F (36.2 C) (Oral)   Ht _0  (1.778 m)   Wt 203 lb 6.4 oz (92.3 kg)   BMI 29.18 kg/m      Assessment & Plan:  1. Thyroid nodule Will do referral to Endocrinologists, more than likely will biopsy? - Ambulatory referral to Endocrinology - CMP14+EGFR - Thyroid Panel With TSH  2. Overweight (BMI 25.0-29.9) - CMP14+EGFR  3. Hx of smoking - CMP14+EGFR   Keep  follow up appts with [place], FNP

## 2017-05-16 NOTE — Patient Instructions (Signed)
Thyroid Nodule A thyroid nodule is an isolatedgrowth of thyroid cells that forms a lump in your thyroid gland. The thyroid gland is a butterfly-shaped gland. It is found in the lower front of your neck. This gland sends chemical messengers (hormones) through your blood to all parts of your body. These hormones are important in regulating your body temperature and helping your body to use energy. Thyroid nodules are common. Most are not cancerous (are benign). You may have one nodule or several nodules. Different types of thyroid nodules include:  Nodules that grow and fill with fluid (thyroid cysts).  Nodules that produce too much thyroid hormone (hot nodules or hyperthyroid).  Nodules that produce no thyroid hormone (cold nodules or hypothyroid).  Nodules that form from cancer cells (thyroid cancers).  What are the causes? Usually, the cause of this condition is not known. What increases the risk? Factors that make this condition more likely to develop include:  Increasing age. Thyroid nodules become more common in people who are older than 77 years of age.  Gender. ? Benign thyroid nodules are more common in women. ? Cancerous (malignant) thyroid nodules are more common in men.  A family history that includes: ? Thyroid nodules. ? Pheochromocytoma. ? Thyroid carcinoma. ? Hyperparathyroidism.  Certain kinds of thyroid diseases, such as Hashimoto thyroiditis.  Lack of iodine.  A history of head and neck radiation, such as from X-rays.  What are the signs or symptoms? It is common for this condition to cause no symptoms. If you have symptoms, they may include:  A lump in your lower neck.  Feeling a lump or tickle in your throat.  Pain in your neck, jaw, or ear.  Having trouble swallowing.  Hot nodules may cause symptoms that include:  Weight loss.  Warm, flushed skin.  Feeling hot.  Feeling nervous.  A racing heartbeat.  Cold nodules may cause symptoms that  include:  Weight gain.  Dry skin.  Brittle hair. This may also occur with hair loss.  Feeling cold.  Fatigue.  Thyroid cancer nodules may cause symptoms that include:  Hard nodules that feel stuck to the thyroid gland.  Hoarseness.  Lumps in the glands near your thyroid (lymph nodes).  How is this diagnosed? A thyroid nodule may be felt by your health care provider during a physical exam. This condition may also be diagnosed based on your symptoms. You may also have tests, including:  An ultrasound. This may be done to confirm the diagnosis.  A biopsy. This involves taking a sample from the nodule and looking at it under a microscope to see if the nodule is benign.  Blood tests to make sure that your thyroid is working properly.  Imaging tests such as MRI or CT scan may be done if: ? Your nodule is large. ? Your nodule is blocking your airway. ? Cancer is suspected.  How is this treated? Treatment depends on the cause and size of your nodule or nodules. If the nodule is benign, treatment may not be necessary. Your health care provider may monitor the nodule to see if it goes away without treatment. If the nodule continues to grow, is cancerous, or does not go away:  It may need to be drained with a needle.  It may need to be removed with surgery.  If you have surgery, part or all of your thyroid gland may need to be removed as well. Follow these instructions at home:  Pay attention to any changes in your   nodule.  Take over-the-counter and prescription medicines only as told by your health care provider.  Keep all follow-up visits as told by your health care provider. This is important. Contact a health care provider if:  Your voice changes.  You have trouble swallowing.  You have pain in your neck, ear, or jaw that is getting worse.  Your nodule gets bigger.  Your nodule starts to make it harder for you to breathe. Get help right away if:  You have a  sudden fever.  You feel very weak.  Your muscles look like they are shrinking (muscle wasting).  You have mood swings.  You feel very restless.  You feel confused.  You are seeing or hearing things that other people do not see or hear (having hallucinations).  You feel suddenly nauseous or throw up.  You suddenly have diarrhea.  You have chest pain.  There is a loss of consciousness. This information is not intended to replace advice given to you by your health care provider. Make sure you discuss any questions you have with your health care provider. Document Released: 03/09/2004 Document Revised: 12/18/2015 Document Reviewed: 07/28/2014 Elsevier Interactive Patient Education  2018 Elsevier Inc.  

## 2017-05-17 LAB — CMP14+EGFR
A/G RATIO: 2 (ref 1.2–2.2)
ALBUMIN: 4.2 g/dL (ref 3.5–4.8)
ALK PHOS: 69 IU/L (ref 39–117)
ALT: 12 IU/L (ref 0–44)
AST: 13 IU/L (ref 0–40)
BILIRUBIN TOTAL: 0.5 mg/dL (ref 0.0–1.2)
BUN / CREAT RATIO: 14 (ref 10–24)
BUN: 14 mg/dL (ref 8–27)
CHLORIDE: 102 mmol/L (ref 96–106)
CO2: 25 mmol/L (ref 20–29)
Calcium: 9.3 mg/dL (ref 8.6–10.2)
Creatinine, Ser: 1.01 mg/dL (ref 0.76–1.27)
GFR calc non Af Amer: 72 mL/min/{1.73_m2} (ref >59)
GFR, EST AFRICAN AMERICAN: 83 mL/min/{1.73_m2} (ref >59)
GLOBULIN, TOTAL: 2.1 g/dL (ref 1.5–4.5)
Glucose: 92 mg/dL (ref 65–99)
Potassium: 4.9 mmol/L (ref 3.5–5.2)
SODIUM: 142 mmol/L (ref 134–144)
TOTAL PROTEIN: 6.3 g/dL (ref 6.0–8.5)

## 2017-05-17 LAB — THYROID PANEL WITH TSH
FREE THYROXINE INDEX: 2.2 (ref 1.2–4.9)
T3 Uptake Ratio: 26 % (ref 24–39)
T4, Total: 8.5 ug/dL (ref 4.5–12.0)
TSH: 1.83 u[IU]/mL (ref 0.450–4.500)

## 2017-05-21 ENCOUNTER — Ambulatory Visit (INDEPENDENT_AMBULATORY_CARE_PROVIDER_SITE_OTHER): Payer: Medicare Other | Admitting: Urology

## 2017-05-21 DIAGNOSIS — C61 Malignant neoplasm of prostate: Secondary | ICD-10-CM

## 2017-06-06 ENCOUNTER — Ambulatory Visit (INDEPENDENT_AMBULATORY_CARE_PROVIDER_SITE_OTHER): Payer: Medicare Other | Admitting: "Endocrinology

## 2017-06-06 ENCOUNTER — Encounter: Payer: Self-pay | Admitting: "Endocrinology

## 2017-06-06 VITALS — BP 117/74 | HR 74 | Ht 70.0 in | Wt 204.0 lb

## 2017-06-06 DIAGNOSIS — E042 Nontoxic multinodular goiter: Secondary | ICD-10-CM | POA: Diagnosis not present

## 2017-06-06 NOTE — Progress Notes (Signed)
Consult Note                                            06/06/2017, 2:00 PM   Subjective:    Patient ID: Howard Ramos, male    DOB: 03-13-1941, PCP Eustaquio Maize, MD   Past Medical History:  Diagnosis Date  . Cancer [facility])    Prostate  . Diabetes mellitus without complication (HCC)    elevated hgb A1C- diet controlled  . Hyperlipidemia    Past Surgical History:  Procedure Laterality Date  . CATARACT EXTRACTION W/PHACO Left 10/29/2013   Procedure: CATARACT EXTRACTION PHACO AND INTRAOCULAR LENS PLACEMENT (IOC);  Surgeon: Tonny Branch, MD;  Location: [facility];  Service: Ophthalmology;  Laterality: Left;  CDE:  15.52  . CATARACT EXTRACTION W/PHACO Right 12/07/2013   Procedure: CATARACT EXTRACTION PHACO AND INTRAOCULAR LENS PLACEMENT (IOC);  Surgeon: Tonny Branch, MD;  Location: [facility];  Service: Ophthalmology;  Laterality: Right;  CDE 16.99  . ROTATOR CUFF REPAIR Right    Social History   Socioeconomic History  . Marital status: Married    Spouse name: None  . Number of children: None  . Years of education: None  . Highest education level: None  Social Needs  . Financial resource strain: None  . Food insecurity - worry: None  . Food insecurity - inability: None  . Transportation needs - medical: None  . Transportation needs - non-medical: None  Occupational History  . None  Tobacco Use  . Smoking status: Former Smoker    Packs/day: 1.00    Years: 40.00    Pack years: 40.00    Types: Cigarettes    Last attempt to quit: 10/24/2007    Years since quitting: 9.6  . Smokeless tobacco: Never Used  Substance and Sexual Activity  . Alcohol use: No  . Drug use: No  . Sexual activity: Yes    Birth control/protection: None  Other Topics Concern  . None  Social History Narrative  . None   No outpatient encounter medications on file as of 06/06/2017.   No facility-administered encounter medications on file as of 06/06/2017.    ALLERGIES: No Known Allergies  VACCINATION  STATUS: Immunization History  Administered Date(s) Administered  . Meningococcal Conjugate 12/28/2015  . Meningococcal Polysaccharide 12/27/2016  . Tdap 09/22/2013    HPI Howard Ramos is 77 y.o. male who presents today with a medical history as above. he is being seen in consultation for multinodular goiter requested by Eustaquio Maize, MD.  -He was known to have multinodular goiter at least from 2017 when he underwent thyroid ultrasound which showed 1.1 and 2 cm nodules on the right lobe of the thyroid.  No intervention was required.  Repeat ultrasound in November 2018 showed right lobe of the thyroid measuring 5.6 cm with 1.4 and 2.2 cm nodules (increasing in size compared to prior exams); and left lobe measuring 4.8 cm with no discrete nodules.  The nodules on the right lobe where described as complex-partially cystic/solid nodules.   -He denies any exposure to neck radiation.  He smoked for 40+ years, on observation for prostate cancer.    He denies any history of prior thyroid dysfunction, lab work from May 16, 2017 was consistent with euthyroid thyroid function test. -Patient denies dysphagia, shortness of breath, voice change.  Patient progressive  weight  gain.  Denies rapid weight change.  He denies palpitations, tremors, heat intolerance.  Review of Systems  Constitutional: + weight gain, no fatigue, no subjective hyperthermia, no subjective hypothermia Eyes: no blurry vision, no xerophthalmia ENT: no sore throat, no nodules palpated in throat, no dysphagia/odynophagia, no hoarseness Cardiovascular: no Chest Pain, no Shortness of Breath, no palpitations, no leg swelling Respiratory: no cough, no SOB Gastrointestinal: no Nausea/Vomiting/Diarhhea Musculoskeletal: no muscle/joint aches Skin: no rashes Neurological: no tremors, no numbness, no tingling, no dizziness Psychiatric: no depression, no anxiety  Objective:    BP 117/74   Pulse 74   Ht 5\' 10"  (1.778 m)   Wt 204 lb  (92.5 kg)   BMI 29.27 kg/m   Wt Readings from Last 3 Encounters:  06/06/17 204 lb (92.5 kg)  05/16/17 203 lb 6.4 oz (92.3 kg)  10/23/13 186 lb (84.4 kg)    Physical Exam  Constitutional: + Overweight, not in acute distress, normal state of mind Eyes: PERRLA, EOMI, no exophthalmos ENT: moist mucous membranes, no thyromegaly, no cervical lymphadenopathy Cardiovascular: normal precordial activity, Regular Rate and Rhythm, no Murmur/Rubs/Gallops Respiratory:  adequate breathing efforts, no gross chest deformity, Clear to auscultation bilaterally Gastrointestinal: abdomen soft, Non -tender, No distension, Bowel Sounds present Musculoskeletal: no gross deformities, strength intact in all four extremities Skin: moist, warm, no rashes Neurological: no tremor with outstretched hands, Deep tendon reflexes normal in all four extremities.  CMP ( most recent) CMP     Component Value Date/Time   NA 142 05/16/2017 1038   K 4.9 05/16/2017 1038   CL 102 05/16/2017 1038   CO2 25 05/16/2017 1038   GLUCOSE 92 05/16/2017 1038   GLUCOSE 78 10/23/2013 1235   BUN 14 05/16/2017 1038   CREATININE 1.01 05/16/2017 1038   CALCIUM 9.3 05/16/2017 1038   PROT 6.3 05/16/2017 1038   ALBUMIN 4.2 05/16/2017 1038   AST 13 05/16/2017 1038   ALT 12 05/16/2017 1038   ALKPHOS 69 05/16/2017 1038   BILITOT 0.5 05/16/2017 1038   GFRNONAA 72 05/16/2017 1038   GFRAA 83 05/16/2017 1038     Results for Howard Ramos, Howard Ramos (MRN [id number]) as of 06/06/2017 14:05  Ref. Range 05/16/2017 10:38  TSH Latest Ref Range: 0.450 - 4.500 uIU/mL 1.830  Thyroxine (T4) Latest Ref Range: 4.5 - 12.0 ug/dL 8.5  Free Thyroxine Index Latest Ref Range: 1.2 - 4.9  2.2  T3 Uptake Ratio Latest Ref Range: 24 - 39 % 26   Repeat ultrasound in November 2018 showed right lobe of the thyroid measuring 5.6 cm with 1.4 and 2.2 cm nodules (increasing in size compared to prior exams); and left lobe measuring 4.8 cm with no discrete nodules.  The nodules on  the right lobe were described as complex-partially cystic/solid nodules.     Assessment & Plan:   1. Multinodular goiter  - Howard Ramos  is being seen at a kind request of Eustaquio Maize, MD. - I have reviewed his available thyroid records and clinically evaluated the patient. - Based on my reviews, he has euthyroid multinodular goiter.  -Given the fact that these nodules grew over time, his 40+ years of smoking, his advanced age of 54,  and history of prostate cancer; I offered him fine-needle aspiration of these 2 nodules to rule out thyroid malignancy, and he agrees. -He will return in 2 weeks to discuss results of his biopsy.  If findings are suspicious, he will be considered for total thyroidectomy. - I did not initiate any  new prescriptions today.  - I advised patient to maintain close follow up with Eustaquio Maize, MD for primary care needs. Follow up plan: Return in about 2 weeks (around 06/20/2017) for follow up with biopsy results.   Glade Lloyd, MD [facility] [address] [place], [place] [address] Phone: [phone]  Fax: [phone]     06/06/2017, 2:00 PM  This note was partially dictated with voice recognition software. Similar sounding words can be transcribed inadequately or may not  be corrected upon review.

## 2017-06-12 ENCOUNTER — Telehealth: Payer: Self-pay | Admitting: "Endocrinology

## 2017-06-12 NOTE — Telephone Encounter (Signed)
Howard Ramos is asking for a returned phone call in regards to scheduling his Thyroid Biopsy, Please advise?

## 2017-06-12 NOTE — Telephone Encounter (Signed)
Pt notified that we are waiting on [facility] radiology to receive the u/s disk then we can get it scheduled.

## 2017-06-17 ENCOUNTER — Other Ambulatory Visit: Payer: Self-pay | Admitting: "Endocrinology

## 2017-06-17 DIAGNOSIS — E042 Nontoxic multinodular goiter: Secondary | ICD-10-CM

## 2017-06-19 ENCOUNTER — Ambulatory Visit (HOSPITAL_COMMUNITY)
Admission: RE | Admit: 2017-06-19 | Discharge: 2017-06-19 | Disposition: A | Discharge status: Home / Self Care | Payer: Medicare Other | Source: Ambulatory Visit | Attending: "Endocrinology | Admitting: "Endocrinology

## 2017-06-19 ENCOUNTER — Encounter (HOSPITAL_COMMUNITY): Payer: Self-pay

## 2017-06-19 DIAGNOSIS — E042 Nontoxic multinodular goiter: Secondary | ICD-10-CM | POA: Insufficient documentation

## 2017-06-19 DIAGNOSIS — E041 Nontoxic single thyroid nodule: Secondary | ICD-10-CM | POA: Diagnosis not present

## 2017-06-19 MED ORDER — LIDOCAINE HCL (PF) 2 % IJ SOLN
INTRAMUSCULAR | Status: AC
  Filled 2017-06-19: qty 10

## 2017-06-21 ENCOUNTER — Ambulatory Visit: Payer: Medicare Other | Admitting: "Endocrinology

## 2017-06-26 ENCOUNTER — Ambulatory Visit (INDEPENDENT_AMBULATORY_CARE_PROVIDER_SITE_OTHER): Payer: Medicare Other | Admitting: "Endocrinology

## 2017-06-26 ENCOUNTER — Encounter: Payer: Self-pay | Admitting: "Endocrinology

## 2017-06-26 VITALS — BP 149/80 | HR 72 | Ht 70.0 in | Wt 204.0 lb

## 2017-06-26 DIAGNOSIS — E042 Nontoxic multinodular goiter: Secondary | ICD-10-CM

## 2017-06-26 NOTE — Progress Notes (Signed)
Endocrinology follow-up note                                            06/26/2017, 1:36 PM   Subjective:    Patient ID: [name], male    DOB: April 29, 1941, PCP Eustaquio Maize, MD   Past Medical History:  Diagnosis Date  . Cancer [facility])    Prostate  . Diabetes mellitus without complication (HCC)    elevated hgb A1C- diet controlled  . Hyperlipidemia    Past Surgical History:  Procedure Laterality Date  . CATARACT EXTRACTION W/PHACO Left 10/29/2013   Procedure: CATARACT EXTRACTION PHACO AND INTRAOCULAR LENS PLACEMENT (IOC);  Surgeon: Tonny Branch, MD;  Location: [facility];  Service: Ophthalmology;  Laterality: Left;  CDE:  15.52  . CATARACT EXTRACTION W/PHACO Right 12/07/2013   Procedure: CATARACT EXTRACTION PHACO AND INTRAOCULAR LENS PLACEMENT (IOC);  Surgeon: Tonny Branch, MD;  Location: [facility];  Service: Ophthalmology;  Laterality: Right;  CDE 16.99  . ROTATOR CUFF REPAIR Right    Social History   Socioeconomic History  . Marital status: Married    Spouse name: None  . Number of children: None  . Years of education: None  . Highest education level: None  Social Needs  . Financial resource strain: None  . Food insecurity - worry: None  . Food insecurity - inability: None  . Transportation needs - medical: None  . Transportation needs - non-medical: None  Occupational History  . None  Tobacco Use  . Smoking status: Former Smoker    Packs/day: 1.00    Years: 40.00    Pack years: 40.00    Types: Cigarettes    Last attempt to quit: 10/24/2007    Years since quitting: 9.6  . Smokeless tobacco: Never Used  Substance and Sexual Activity  . Alcohol use: No  . Drug use: No  . Sexual activity: Yes    Birth control/protection: None  Other Topics Concern  . None  Social History Narrative  . None   No outpatient encounter medications on file as of 06/26/2017.   No facility-administered encounter medications on file as of 06/26/2017.    ALLERGIES: No Known  Allergies  VACCINATION STATUS: Immunization History  Administered Date(s) Administered  . Meningococcal Conjugate 12/28/2015  . Meningococcal Polysaccharide 12/27/2016  . Tdap 09/22/2013    HPI Howard Ramos is 77 y.o. male who presents today with a medical history as above. he is being seen in follow-up after fine-needle aspiration of nodules in the right lobe of his thyroid.    -He was known to have multinodular goiter at least from 2017 when he underwent thyroid ultrasound which showed 1.1 and 2 cm nodules on the right lobe of the thyroid.  No intervention was required.  Repeat ultrasound in November 2018 showed right lobe of the thyroid measuring 5.6 cm with 1.4 and 2.2 cm nodules (increasing in size compared to prior exams); and left lobe measuring 4.8 cm with no discrete nodules.  The nodules on the right lobe where described as complex-partially cystic/solid nodules.   -He denies any exposure to neck radiation.  He smoked for 40+ years, on observation for prostate cancer.    He denies any history of prior thyroid dysfunction, lab work from May 16, 2017 was consistent with euthyroid thyroid function test. -Patient denies dysphagia, shortness of breath, voice  change.  Patient progressive  weight gain.  Denies rapid weight change.  He denies palpitations, tremors, heat intolerance. -The biopsy findings are benign.  Review of Systems  Constitutional: + steady weight , no fatigue, no subjective hyperthermia, no subjective hypothermia Eyes: no blurry vision, no xerophthalmia ENT: no sore throat, no nodules palpated in throat, no dysphagia, no hoarseness of voice.  Cardiovascular: no Chest Pain, no Shortness of Breath, no palpitations, no leg swelling Respiratory: no cough, no SOB Gastrointestinal: no Nausea/Vomiting/Diarhhea Musculoskeletal: no muscle/joint aches Skin: no rashes Neurological: no tremors, no numbness, no tingling, no dizziness Psychiatric: no depression, no  anxiety  Objective:    BP (!) 149/80   Pulse 72   Ht 5\' 10"  (1.778 m)   Wt 204 lb (92.5 kg)   BMI 29.27 kg/m   Wt Readings from Last 3 Encounters:  06/26/17 204 lb (92.5 kg)  06/06/17 204 lb (92.5 kg)  05/16/17 203 lb 6.4 oz (92.3 kg)    Physical Exam  Constitutional: + Overweight, not in acute distress, normal state of mind.   Eyes: PERRLA, EOMI, no exophthalmos ENT: moist mucous membranes, no thyromegaly, no cervical lymphadenopathy Cardiovascular: normal precordial activity, Regular Rate and Rhythm, no Murmur/Rubs/Gallops Respiratory:  adequate breathing efforts, no gross chest deformity, Clear to auscultation bilaterally Gastrointestinal: abdomen soft, Non -tender, No distension, Bowel Sounds present Musculoskeletal: no gross deformities, strength intact in all four extremities Skin: moist, warm, no rashes Neurological: no tremor with outstretched hands, Deep tendon reflexes normal in all four extremities.  CMP ( most recent) CMP     Component Value Date/Time   NA 142 05/16/2017 1038   K 4.9 05/16/2017 1038   CL 102 05/16/2017 1038   CO2 25 05/16/2017 1038   GLUCOSE 92 05/16/2017 1038   GLUCOSE 78 10/23/2013 1235   BUN 14 05/16/2017 1038   CREATININE 1.01 05/16/2017 1038   CALCIUM 9.3 05/16/2017 1038   PROT 6.3 05/16/2017 1038   ALBUMIN 4.2 05/16/2017 1038   AST 13 05/16/2017 1038   ALT 12 05/16/2017 1038   ALKPHOS 69 05/16/2017 1038   BILITOT 0.5 05/16/2017 1038   GFRNONAA 72 05/16/2017 1038   GFRAA 83 05/16/2017 1038     Results for NICHOLOS, ALOISI (MRN [id number]) as of 06/06/2017 14:05  Ref. Range 05/16/2017 10:38  TSH Latest Ref Range: 0.450 - 4.500 uIU/mL 1.830  Thyroxine (T4) Latest Ref Range: 4.5 - 12.0 ug/dL 8.5  Free Thyroxine Index Latest Ref Range: 1.2 - 4.9  2.2  T3 Uptake Ratio Latest Ref Range: 24 - 39 % 26   Repeat ultrasound in November 2018 showed right lobe of the thyroid measuring 5.6 cm with 1.4 and 2.2 cm nodules (increasing in size compared  to prior exams); and left lobe measuring 4.8 cm with no discrete nodules.  The nodules on the right lobe were described as complex-partially cystic/solid nodules.  -   Fine-needle aspiration of 2 nodules in the right lobe of his thyroid on June 19, 2017 revealed benign findings.   Assessment & Plan:   1. Multinodular goiter -He is clinically and biochemically euthyroid.  Fine-needle aspiration of 2 nodules in the right lobe of his thyroid on June 19, 2017 revealed benign findings. -No intervention is required at this time.  He will return in 1 year with repeat thyroid function tests. - I did not initiate any new prescriptions today.  - I advised patient to maintain close follow up with Eustaquio Maize, MD for primary care needs. Follow  up plan: Return in about 1 year (around 06/26/2018) for follow up with pre-visit labs.   Glade Lloyd, MD [facility] [address] [place], [place] [address] Phone: [phone]  Fax: [phone]     06/26/2017, 1:36 PM  This note was partially dictated with voice recognition software. Similar sounding words can be transcribed inadequately or may not  be corrected upon review.

## 2017-06-27 ENCOUNTER — Encounter: Payer: Self-pay | Admitting: Family

## 2017-06-27 ENCOUNTER — Ambulatory Visit (INDEPENDENT_AMBULATORY_CARE_PROVIDER_SITE_OTHER): Payer: Medicare Other | Admitting: Family

## 2017-06-27 VITALS — BP 128/68 | HR 78 | Temp 97.0°F | Ht 70.0 in | Wt 202.0 lb

## 2017-06-27 DIAGNOSIS — R030 Elevated blood-pressure reading, without diagnosis of hypertension: Secondary | ICD-10-CM

## 2017-06-27 NOTE — Patient Instructions (Signed)
How to Take Your Blood Pressure You can take your blood pressure at home with a machine. You may need to check your blood pressure at home:  To check if you have high blood pressure (hypertension).  To check your blood pressure over time.  To make sure your blood pressure medicine is working.  Supplies needed: You will need a blood pressure machine, or monitor. You can buy one at a drugstore or online. When choosing one:  Choose one with an arm cuff.  Choose one that wraps around your upper arm. Only one finger should fit between your arm and the cuff.  Do not choose one that measures your blood pressure from your wrist or finger.  Your doctor can suggest a monitor. How to prepare Avoid these things for 30 minutes before checking your blood pressure:  Drinking caffeine.  Drinking alcohol.  Eating.  Smoking.  Exercising.  Five minutes before checking your blood pressure:  Pee.  Sit in a dining chair. Avoid sitting in a soft couch or armchair.  Be quiet. Do not talk.  How to take your blood pressure Follow the instructions that came with your machine. If you have a digital blood pressure monitor, these may be the instructions: 1. Sit up straight. 2. Place your feet on the floor. Do not cross your ankles or legs. 3. Rest your left arm at the level of your heart. You may rest it on a table, desk, or chair. 4. Pull up your shirt sleeve. 5. Wrap the blood pressure cuff around the upper part of your left arm. The cuff should be 1 inch (2.5 cm) above your elbow. It is best to wrap the cuff around bare skin. 6. Fit the cuff snugly around your arm. You should be able to place only one finger between the cuff and your arm. 7. Put the cord inside the groove of your elbow. 8. Press the power button. 9. Sit quietly while the cuff fills with air and loses air. 10. Write down the numbers on the screen. 11. Wait 2-3 minutes and then repeat steps 1-10.  What do the numbers  mean? Two numbers make up your blood pressure. The first number is called systolic pressure. The second is called diastolic pressure. An example of a blood pressure reading is "120 over 80" (or 120/80). If you are an adult and do not have a medical condition, use this guide to find out if your blood pressure is normal: Normal  First number: below 120.  Second number: below 80. Elevated  First number: 120-129.  Second number: below 80. Hypertension stage 1  First number: 130-139.  Second number: 80-89. Hypertension stage 2  First number: 140 or above.  Second number: 90 or above. Your blood pressure is above normal even if only the top or bottom number is above normal. Follow these instructions at home:  Check your blood pressure as often as your doctor tells you to.  Take your monitor to your next doctor's appointment. Your doctor will: ? Make sure you are using it correctly. ? Make sure it is working right.  Make sure you understand what your blood pressure numbers should be.  Tell your doctor if your medicines are causing side effects. Contact a doctor if:  Your blood pressure keeps being high. Get help right away if:  Your first blood pressure number is higher than 180.  Your second blood pressure number is higher than 120. This information is not intended to replace advice given   to you by your health care provider. Make sure you discuss any questions you have with your health care provider. Document Released: 03/29/2008 Document Revised: 03/14/2016 Document Reviewed: 09/23/2015 Elsevier Interactive Patient Education  2018 Elsevier Inc.  

## 2017-06-27 NOTE — Progress Notes (Signed)
   Subjective:    Patient ID: Howard Ramos, male    DOB: 1940-11-15, 77 y.o.   MRN: [id number]  HPI PT presents to the office today to recheck BP. Pt states he went to he Endocrinologists and was told his BP was elevated. PT states he took it several times at home and it was averaging 160's/90's. Pt states his machine is "pretty old".  PT's BP is normal today.   Review of Systems  All other systems reviewed and are negative.      Objective:   Physical Exam  Constitutional: He is oriented to person, place, and time. He appears well-developed and well-nourished. No distress.  HENT:  Head: Normocephalic.  Eyes: Pupils are equal, round, and reactive to light. Right eye exhibits no discharge. Left eye exhibits no discharge.  Neck: Normal range of motion. Neck supple. No thyromegaly present.  Cardiovascular: Normal rate, regular rhythm, normal heart sounds and intact distal pulses.  No murmur heard. Pulmonary/Chest: Effort normal and breath sounds normal. No respiratory distress. He has no wheezes.  Abdominal: Soft. Bowel sounds are normal. He exhibits no distension. There is no tenderness.  Musculoskeletal: Normal range of motion. He exhibits no edema or tenderness.  Neurological: He is alert and oriented to person, place, and time.  Skin: Skin is warm and dry. No rash noted. No erythema.  Psychiatric: He has a normal mood and affect. His behavior is normal. Judgment and thought content normal.  Vitals reviewed.     BP 128/68   Pulse 78   Temp (!) 97 F (36.1 C) (Oral)   Ht 5\' 10"  (1.778 m)   Wt 202 lb (91.6 kg)   BMI 28.98 kg/m      Assessment & Plan:  1. Elevated blood pressure, situational -PT's BP is normal today, discussed stress and anxiety can cause this to be elevated. Encouraged him to bring his home machine to next appt -Dash diet information given -Exercise encouraged - Stress Management  -Continue current meds -RTO in 6 months    Evelina Dun, FNP

## 2017-07-02 NOTE — Progress Notes (Signed)
Subjective: CC: "infection" PCP: Howard Balloon, FNP FTD:[name] is a 77 y.o. male presenting to clinic today for:  1. LLE pain Patient reports onset of left lower extremity knots on the anterior leg about 7-10 days ago.  He notes it has progressively gotten worse.  He reports that something similar happened about 5 years ago and he was sent to have a venous ultrasound done of the lower extremity to rule out DVT.  It was found that he actually had an infection in the lower extremity and was negative for DVT.  He reports that this feels exactly the same as that.  He denies lower extremity swelling.  No posterior calf pain.  Minimal redness.  Minimal increased warmth.  No fevers, chills, nausea, vomiting.  No chest pain, shortness of breath, heart palpitations.  He is on a daily aspirin at baseline because he was told that the infection occurred because he had poor circulation to the lower extremities.   ROS: Per HPI  No Known Allergies Past Medical History:  Diagnosis Date  . Cancer [facility])    Prostate  . Diabetes mellitus without complication (HCC)    elevated hgb A1C- diet controlled  . Hyperlipidemia    No current outpatient medications on file. Social History   Socioeconomic History  . Marital status: Married    Spouse name: Not on file  . Number of children: Not on file  . Years of education: Not on file  . Highest education level: Not on file  Social Needs  . Financial resource strain: Not on file  . Food insecurity - worry: Not on file  . Food insecurity - inability: Not on file  . Transportation needs - medical: Not on file  . Transportation needs - non-medical: Not on file  Occupational History  . Not on file  Tobacco Use  . Smoking status: Former Smoker    Packs/day: 1.00    Years: 40.00    Pack years: 40.00    Types: Cigarettes    Last attempt to quit: 10/24/2007    Years since quitting: 9.6  . Smokeless tobacco: Never Used  Substance and Sexual Activity    . Alcohol use: No  . Drug use: No  . Sexual activity: Yes    Birth control/protection: None  Other Topics Concern  . Not on file  Social History Narrative  . Not on file   Family History  Problem Relation Age of Onset  . Hypertension Mother   . Cancer Father        Pancreatic     Objective: Office vital signs reviewed. BP 140/73   Pulse 77   Temp (!) 96.8 F (36 C) (Oral)   Ht 5\' 10"  (1.778 m)   Wt 206 lb (93.4 kg)   BMI 29.56 kg/m   Physical Examination:  General: Awake, alert, well nourished, well appearing, No acute distress Cardio: regular rate, +1 DP to LE Pulm: normal work of breathing on room air Extremities: warm, well perfused, No edema, cyanosis or clubbing; +1 pulses bilaterally  LLE: No swelling.  No calf tenderness.  No palpable cords.  Negative Homans sign.  There are 2 discrete nodules to the anterior medial aspect of the lower leg that are palpable.  These seem to coincide with varicose veins.  He has minimal erythema overlying nodules.  No palpable fluctuance or induration.  These nodules are tender to palpation.  RLE: No swelling.  No calf tenderness.  No palpable cords.  Negative  Homans sign.  Varicose veins noted. MSK: normal gait and normal station Skin: as above  Assessment/ Plan: 77 y.o. male   1. Cellulitis of left lower extremity Would consider this infection to be very mild.  If anything he may be having irritation of varicose veins.  However, because he has had similar symptoms in the past that were improved by antibiotics, I have placed him on Keflex p.o. 3 times daily for the next 10 days.  No evidence of DVT on today's exam but we did discuss that if symptoms were to worsen or he were to develop any other red flag symptoms that he should seek immediate medical attention for reevaluation.  He has a Wells score for DVT 0 but is being actively SURVEYED for prostate cancer.  Patient was good understanding and will follow-up on Friday if symptoms  are not improving.   Meds ordered this encounter  Medications  . cephALEXin (KEFLEX) 500 MG capsule    Sig: Take 1 capsule (500 mg total) by mouth 3 (three) times daily.    Dispense:  30 capsule    Refill:  [place], DO [place] [phone]

## 2017-07-03 ENCOUNTER — Ambulatory Visit (INDEPENDENT_AMBULATORY_CARE_PROVIDER_SITE_OTHER): Payer: Medicare Other | Admitting: Family Medicine

## 2017-07-03 ENCOUNTER — Encounter: Payer: Self-pay | Admitting: Family Medicine

## 2017-07-03 VITALS — BP 140/73 | HR 77 | Temp 96.8°F | Ht 70.0 in | Wt 206.0 lb

## 2017-07-03 DIAGNOSIS — L03116 Cellulitis of left lower limb: Secondary | ICD-10-CM | POA: Diagnosis not present

## 2017-07-03 DIAGNOSIS — I83812 Varicose veins of left lower extremities with pain: Secondary | ICD-10-CM | POA: Diagnosis not present

## 2017-07-03 MED ORDER — CEPHALEXIN 500 MG PO CAPS: 500.0000 mg | ORAL_CAPSULE | Freq: Three times a day (TID) | ORAL | 0 refills | Status: DC

## 2017-07-03 NOTE — Patient Instructions (Signed)

## 2017-08-08 ENCOUNTER — Encounter: Payer: Self-pay | Admitting: *Deleted

## 2017-10-28 DIAGNOSIS — H1851 Endothelial corneal dystrophy: Secondary | ICD-10-CM | POA: Diagnosis not present

## 2017-10-28 DIAGNOSIS — H31092 Other chorioretinal scars, left eye: Secondary | ICD-10-CM | POA: Diagnosis not present

## 2017-10-28 DIAGNOSIS — Z961 Presence of intraocular lens: Secondary | ICD-10-CM | POA: Diagnosis not present

## 2017-10-28 DIAGNOSIS — D3131 Benign neoplasm of right choroid: Secondary | ICD-10-CM | POA: Diagnosis not present

## 2017-10-28 DIAGNOSIS — H26492 Other secondary cataract, left eye: Secondary | ICD-10-CM | POA: Diagnosis not present

## 2017-11-11 DIAGNOSIS — H26491 Other secondary cataract, right eye: Secondary | ICD-10-CM | POA: Diagnosis not present

## 2017-11-12 DIAGNOSIS — C61 Malignant neoplasm of prostate: Secondary | ICD-10-CM | POA: Diagnosis not present

## 2017-11-19 ENCOUNTER — Ambulatory Visit (INDEPENDENT_AMBULATORY_CARE_PROVIDER_SITE_OTHER): Payer: Medicare Other | Admitting: Urology

## 2017-11-19 DIAGNOSIS — C61 Malignant neoplasm of prostate: Secondary | ICD-10-CM

## 2017-12-25 ENCOUNTER — Encounter: Payer: Medicare Other | Admitting: Family

## 2018-05-05 DIAGNOSIS — C61 Malignant neoplasm of prostate: Secondary | ICD-10-CM | POA: Diagnosis not present

## 2018-05-13 ENCOUNTER — Ambulatory Visit (INDEPENDENT_AMBULATORY_CARE_PROVIDER_SITE_OTHER): Payer: Medicare Other | Admitting: Urology

## 2018-05-13 DIAGNOSIS — C61 Malignant neoplasm of prostate: Secondary | ICD-10-CM | POA: Diagnosis not present

## 2018-05-13 DIAGNOSIS — N5201 Erectile dysfunction due to arterial insufficiency: Secondary | ICD-10-CM

## 2018-05-15 DIAGNOSIS — L820 Inflamed seborrheic keratosis: Secondary | ICD-10-CM | POA: Diagnosis not present

## 2018-05-15 DIAGNOSIS — L308 Other specified dermatitis: Secondary | ICD-10-CM | POA: Diagnosis not present

## 2018-05-15 DIAGNOSIS — L218 Other seborrheic dermatitis: Secondary | ICD-10-CM | POA: Diagnosis not present

## 2018-06-13 ENCOUNTER — Other Ambulatory Visit: Payer: Self-pay | Admitting: Urology

## 2018-06-16 ENCOUNTER — Other Ambulatory Visit: Payer: Self-pay | Admitting: Urology

## 2018-06-16 DIAGNOSIS — C61 Malignant neoplasm of prostate: Secondary | ICD-10-CM

## 2018-06-26 ENCOUNTER — Ambulatory Visit: Payer: Medicare Other | Admitting: "Endocrinology

## 2018-08-04 ENCOUNTER — Other Ambulatory Visit: Payer: Medicare Other

## 2018-09-26 ENCOUNTER — Other Ambulatory Visit: Payer: Medicare Other

## 2018-10-16 ENCOUNTER — Other Ambulatory Visit: Payer: Self-pay

## 2018-10-16 ENCOUNTER — Ambulatory Visit (INDEPENDENT_AMBULATORY_CARE_PROVIDER_SITE_OTHER): Payer: Medicare Other | Admitting: *Deleted

## 2018-10-16 DIAGNOSIS — Z0000 Encounter for general adult medical examination without abnormal findings: Secondary | ICD-10-CM | POA: Diagnosis not present

## 2018-10-16 NOTE — Progress Notes (Addendum)
MEDICARE ANNUAL WELLNESS VISIT  10/16/2018  Telephone Visit Disclaimer This Medicare AWV was conducted by telephone due to national recommendations for restrictions regarding the COVID-19 Pandemic (e.g. social distancing).  I verified, using two identifiers, that I am speaking with Howard Ramos or their authorized healthcare agent. I discussed the limitations, risks, security, and privacy concerns of performing an evaluation and management service by telephone and the potential availability of an in-person appointment in the future. The patient expressed understanding and agreed to proceed.   Subjective:  Howard Ramos is a 78 y.o. male patient of Hawks, Howard Hawthorne, FNP who had a Medicare Annual Wellness Visit today via telephone. Ziyon is Retired and lives with their spouse. he has 3 children. he reports that he is socially active and does interact with friends/family regularly. he is minimally physically active and enjoys Agricultural consultant work.  Patient Care Team: Sharion Balloon, FNP as PCP - General (Family Medicine)  Advanced Directives 10/16/2018 10/23/2013  Does Patient Have a Medical Advance Directive? No Patient does not have advance directive;Patient would not like information  Would patient like information on creating a medical advance directive? No - Patient declined -  Pre-existing out of facility DNR order (yellow form or pink MOST form) - No    Hospital Utilization Over the Past 12 Months: # of hospitalizations or ER visits: 0 # of surgeries: 0  Review of Systems    Patient reports that his overall health is better compared to last year.  Patient Reported Readings (BP, Pulse, CBG, Weight, etc) none  Review of Systems: No complaints  All other systems negative.  Pain Assessment Pain : No/denies pain     Current Medications & Allergies (verified) Allergies as of 10/16/2018   No Known Allergies     Medication List       Accurate as of October 16, 2018   8:41 AM. If you have any questions, ask your nurse or doctor.        STOP taking these medications   cephALEXin 500 MG capsule Commonly known as: KEFLEX     TAKE these medications   b complex vitamins tablet Take 1 tablet by mouth daily.       History (reviewed): Past Medical History:  Diagnosis Date  . Cancer [facility])    Prostate  . Diabetes mellitus without complication (HCC)    elevated hgb A1C- diet controlled  . Hyperlipidemia    Past Surgical History:  Procedure Laterality Date  . CATARACT EXTRACTION W/PHACO Left 10/29/2013   Procedure: CATARACT EXTRACTION PHACO AND INTRAOCULAR LENS PLACEMENT (IOC);  Surgeon: Tonny Branch, MD;  Location: [facility];  Service: Ophthalmology;  Laterality: Left;  CDE:  15.52  . CATARACT EXTRACTION W/PHACO Right 12/07/2013   Procedure: CATARACT EXTRACTION PHACO AND INTRAOCULAR LENS PLACEMENT (IOC);  Surgeon: Tonny Branch, MD;  Location: [facility];  Service: Ophthalmology;  Laterality: Right;  CDE 16.99  . ROTATOR CUFF REPAIR Right    Family History  Problem Relation Age of Onset  . Hypertension Mother   . Cancer Father        Pancreatic    Social History   Socioeconomic History  . Marital status: Married    Spouse name: Howard Ramos  . Number of children: 3  . Years of education: 19  . Highest education level: Some college, no degree  Occupational History  . Occupation: retired    Comment: part-time as Dealer, painting  Social Needs  . Financial resource strain: Not  hard at all  . Food insecurity    Worry: Never true    Inability: Never true  . Transportation needs    Medical: No    Non-medical: No  Tobacco Use  . Smoking status: Former Smoker    Packs/day: 1.00    Years: 40.00    Pack years: 40.00    Types: Cigarettes    Quit date: 10/24/2007    Years since quitting: 10.9  . Smokeless tobacco: Never Used  Substance and Sexual Activity  . Alcohol use: No  . Drug use: No  . Sexual activity: Not Currently    Birth control/protection:  None  Lifestyle  . Physical activity    Days per week: 0 days    Minutes per session: 0 min  . Stress: Not at all  Relationships  . Social connections    Talks on phone: More than three times a week    Gets together: More than three times a week    Attends religious service: More than 4 times per year    Active member of club or organization: Yes    Attends meetings of clubs or organizations: More than 4 times per year    Relationship status: Married  Other Topics Concern  . Not on file  Social History Narrative  . Not on file    Activities of Daily Living In your present state of health, do you have any difficulty performing the following activities: 10/16/2018  Hearing? N  Vision? N  Difficulty concentrating or making decisions? N  Walking or climbing stairs? N  Dressing or bathing? N  Doing errands, shopping? N  Preparing Food and eating ? N  Using the Toilet? N  In the past six months, have you accidently leaked urine? N  Do you have problems with loss of bowel control? N  Managing your Medications? N  Managing your Finances? N  Housekeeping or managing your Housekeeping? N  Some recent data might be hidden    Patient Literacy How often do you need to have someone help you when you read instructions, pamphlets, or other written materials from your doctor or pharmacy?: 1 - Never What is the last grade level you completed in school?: 12th grade  Exercise Current Exercise Habits: The patient has a physically strenuous job, but has no regular exercise apart from work., Exercise limited by: None identified  Diet Patient reports consuming 3 meals a day and 2 snack(s) a day Patient reports that his primary diet is: Regular Patient reports that she does have regular access to food.   Depression Screen PHQ 2/9 Scores 10/16/2018 07/03/2017 06/27/2017 06/26/2017 05/16/2017  PHQ - 2 Score 0 0 0 0 0     Fall Risk Fall Risk  10/16/2018 07/03/2017 06/27/2017 06/26/2017 05/16/2017   Falls in the past year? 0 No No No No     Objective:  Howard Ramos seemed alert and oriented and he participated appropriately during our telephone visit.  Blood Pressure Weight BMI  BP Readings from Last 3 Encounters:  07/03/17 140/73  06/27/17 128/68  06/26/17 (!) 149/80   Wt Readings from Last 3 Encounters:  07/03/17 206 lb (93.4 kg)  06/27/17 202 lb (91.6 kg)  06/26/17 204 lb (92.5 kg)   BMI Readings from Last 1 Encounters:  07/03/17 29.56 kg/m    *Unable to obtain current vital signs, weight, and BMI due to telephone visit type  Hearing/Vision  . Marcelles did not seem to have difficulty with hearing/understanding during the  telephone conversation . Reports that he has not had a formal eye exam by an eye care professional within the past year . Reports that he has not had a formal hearing evaluation within the past year *Unable to fully assess hearing and vision during telephone visit type  Cognitive Function: 6CIT Screen 10/16/2018  What Year? 0 points  What month? 0 points  What time? 0 points  Count back from 20 0 points  Months in reverse 0 points  Repeat phrase 0 points  Total Score 0   (Normal:0-7, Significant for Dysfunction: >8)  Normal Cognitive Function Screening: Yes   Immunization & Health Maintenance Record Immunization History  Administered Date(s) Administered  . Meningococcal Conjugate 12/28/2015  . Meningococcal Polysaccharide 12/27/2016  . Tdap 09/22/2013    Health Maintenance  Topic Date Due  . PNA vac Low Risk Adult (1 of 2 - PCV13) 03/08/2006  . INFLUENZA VACCINE  11/29/2018  . TETANUS/TDAP  09/23/2023       Assessment  This is a routine wellness examination for [facility].  Health Maintenance: Due or Overdue Health Maintenance Due  Topic Date Due  . PNA vac Low Risk Adult (1 of 2 - PCV13) 03/08/2006    Howard Ramos does not need a referral for Community Assistance: Care Management:   no Social Work:    no Prescription  Assistance:  no Nutrition/Diabetes Education:  no   Plan:  Personalized Goals Goals Addressed            This Visit's Progress   . Patient Stated (pt-stated)       "I would like to get back to walking 2 miles a day when the parks open back up"      Personalized Health Maintenance & Screening Recommendations  Advanced directives: has NO advanced directive - not interested in additional information  Lung Cancer Screening Recommended: no (Low Dose CT Chest recommended if Age 44-80 years, 30 pack-year currently smoking OR have quit w/in past 15 years) Hepatitis C Screening recommended: no HIV Screening recommended: no  Advanced Directives: Written information was not prepared per patient's request.  Referrals & Orders No orders of the defined types were placed in this encounter.   Follow-up Plan . Follow-up with Sharion Balloon, FNP as planned    I have personally reviewed and noted the following in the patient's chart:   . Medical and social history . Use of alcohol, tobacco or illicit drugs  . Current medications and supplements . Functional ability and status . Nutritional status . Physical activity . Advanced directives . List of other physicians . Hospitalizations, surgeries, and ER visits in previous 12 months . Vitals . Screenings to include cognitive, depression, and falls . Referrals and appointments  In addition, I have reviewed and discussed with Howard Ramos certain preventive protocols, quality metrics, and best practice recommendations. A written personalized care plan for preventive services as well as general preventive health recommendations is available and can be mailed to the patient at his request.      Marylin Crosby, LPN  1/61/0960    I have reviewed and agree with the above AWV documentation.   Evelina Dun, FNP

## 2018-10-16 NOTE — Patient Instructions (Signed)
Preventive Care 2 Years and Older, Male Preventive care refers to lifestyle choices and visits with your health care provider that can promote health and wellness. What does preventive care include?   A yearly physical exam. This is also called an annual well check.  Dental exams once or twice a year.  Routine eye exams. Ask your health care provider how often you should have your eyes checked.  Personal lifestyle choices, including: ? Daily care of your teeth and gums. ? Regular physical activity. ? Eating a healthy diet. ? Avoiding tobacco and drug use. ? Limiting alcohol use. ? Practicing safe sex. ? Taking low doses of aspirin every day. ? Taking vitamin and mineral supplements as recommended by your health care provider. What happens during an annual well check? The services and screenings done by your health care provider during your annual well check will depend on your age, overall health, lifestyle risk factors, and family history of disease. Counseling Your health care provider may ask you questions about your:  Alcohol use.  Tobacco use.  Drug use.  Emotional well-being.  Home and relationship well-being.  Sexual activity.  Eating habits.  History of falls.  Memory and ability to understand (cognition).  Work and work Statistician. Screening You may have the following tests or measurements:  Height, weight, and BMI.  Blood pressure.  Lipid and cholesterol levels. These may be checked every 5 years, or more frequently if you are over 9 years old.  Skin check.  Lung cancer screening. You may have this screening every year starting at age 57 if you have a 30-pack-year history of smoking and currently smoke or have quit within the past 15 years.  Colorectal cancer screening. All adults should have this screening starting at age 90 and continuing until age 69. You will have tests every 1-10 years, depending on your results and the type of screening  test. People at increased risk should start screening at an earlier age. Screening tests may include: ? Guaiac-based fecal occult blood testing. ? Fecal immunochemical test (FIT). ? Stool DNA test. ? Virtual colonoscopy. ? Sigmoidoscopy. During this test, a flexible tube with a tiny camera (sigmoidoscope) is used to examine your rectum and lower colon. The sigmoidoscope is inserted through your anus into your rectum and lower colon. ? Colonoscopy. During this test, a long, thin, flexible tube with a tiny camera (colonoscope) is used to examine your entire colon and rectum.  Prostate cancer screening. Recommendations will vary depending on your family history and other risks.  Hepatitis C blood test.  Hepatitis B blood test.  Sexually transmitted disease (STD) testing.  Diabetes screening. This is done by checking your blood sugar (glucose) after you have not eaten for a while (fasting). You may have this done every 1-3 years.  Abdominal aortic aneurysm (AAA) screening. You may need this if you are a current or former smoker.  Osteoporosis. You may be screened starting at age 30 if you are at high risk. Talk with your health care provider about your test results, treatment options, and if necessary, the need for more tests. Vaccines Your health care provider may recommend certain vaccines, such as:  Influenza vaccine. This is recommended every year.  Tetanus, diphtheria, and acellular pertussis (Tdap, Td) vaccine. You may need a Td booster every 10 years.  Varicella vaccine. You may need this if you have not been vaccinated.  Zoster vaccine. You may need this after age 42.  Measles, mumps, and rubella (MMR) vaccine.  You may need at least one dose of MMR if you were born in 1957 or later. You may also need a second dose.  Pneumococcal 13-valent conjugate (PCV13) vaccine. One dose is recommended after age 65.  Pneumococcal polysaccharide (PPSV23) vaccine. One dose is recommended  after age 65.  Meningococcal vaccine. You may need this if you have certain conditions.  Hepatitis A vaccine. You may need this if you have certain conditions or if you travel or work in places where you may be exposed to hepatitis A.  Hepatitis B vaccine. You may need this if you have certain conditions or if you travel or work in places where you may be exposed to hepatitis B.  Haemophilus influenzae type b (Hib) vaccine. You may need this if you have certain risk factors. Talk to your health care provider about which screenings and vaccines you need and how often you need them. This information is not intended to replace advice given to you by your health care provider. Make sure you discuss any questions you have with your health care provider. Document Released: 05/13/2015 Document Revised: 06/06/2017 Document Reviewed: 02/15/2015 Elsevier Interactive Patient Education  2019 [employer]  

## 2018-10-20 ENCOUNTER — Other Ambulatory Visit: Payer: Self-pay

## 2018-10-21 ENCOUNTER — Ambulatory Visit (INDEPENDENT_AMBULATORY_CARE_PROVIDER_SITE_OTHER): Payer: Medicare Other | Admitting: Family

## 2018-10-21 ENCOUNTER — Other Ambulatory Visit: Payer: Self-pay

## 2018-10-21 ENCOUNTER — Encounter: Payer: Self-pay | Admitting: Family

## 2018-10-21 VITALS — BP 147/90 | HR 65 | Temp 96.8°F | Ht 70.0 in | Wt 200.4 lb

## 2018-10-21 DIAGNOSIS — E663 Overweight: Secondary | ICD-10-CM | POA: Diagnosis not present

## 2018-10-21 DIAGNOSIS — E785 Hyperlipidemia, unspecified: Secondary | ICD-10-CM

## 2018-10-21 DIAGNOSIS — B372 Candidiasis of skin and nail: Secondary | ICD-10-CM | POA: Diagnosis not present

## 2018-10-21 DIAGNOSIS — C61 Malignant neoplasm of prostate: Secondary | ICD-10-CM | POA: Diagnosis not present

## 2018-10-21 LAB — LIPID PANEL

## 2018-10-21 MED ORDER — NYSTATIN 100000 UNIT/GM EX OINT: 1.0000 "application " | TOPICAL_OINTMENT | Freq: Two times a day (BID) | CUTANEOUS | 0 refills | Status: DC

## 2018-10-21 MED ORDER — NYSTATIN 100000 UNIT/GM EX POWD: Freq: Four times a day (QID) | CUTANEOUS | 0 refills | Status: DC

## 2018-10-21 NOTE — Patient Instructions (Signed)
Health Maintenance After Age 78 After age 78, you are at a higher risk for certain long-term diseases and infections as well as injuries from falls. Falls are a major cause of broken bones and head injuries in people who are older than age 78. Getting regular preventive care can help to keep you healthy and well. Preventive care includes getting regular testing and making lifestyle changes as recommended by your health care provider. Talk with your health care provider about:  Which screenings and tests you should have. A screening is a test that checks for a disease when you have no symptoms.  A diet and exercise plan that is right for you. What should I know about screenings and tests to prevent falls? Screening and testing are the best ways to find a health problem early. Early diagnosis and treatment give you the best chance of managing medical conditions that are common after age 78. Certain conditions and lifestyle choices may make you more likely to have a fall. Your health care provider may recommend:  Regular vision checks. Poor vision and conditions such as cataracts can make you more likely to have a fall. If you wear glasses, make sure to get your prescription updated if your vision changes.  Medicine review. Work with your health care provider to regularly review all of the medicines you are taking, including over-the-counter medicines. Ask your health care provider about any side effects that may make you more likely to have a fall. Tell your health care provider if any medicines that you take make you feel dizzy or sleepy.  Osteoporosis screening. Osteoporosis is a condition that causes the bones to get weaker. This can make the bones weak and cause them to break more easily.  Blood pressure screening. Blood pressure changes and medicines to control blood pressure can make you feel dizzy.  Strength and balance checks. Your health care provider may recommend certain tests to check your  strength and balance while standing, walking, or changing positions.  Foot health exam. Foot pain and numbness, as well as not wearing proper footwear, can make you more likely to have a fall.  Depression screening. You may be more likely to have a fall if you have a fear of falling, feel emotionally low, or feel unable to do activities that you used to do.  Alcohol use screening. Using too much alcohol can affect your balance and may make you more likely to have a fall. What actions can I take to lower my risk of falls? General instructions  Talk with your health care provider about your risks for falling. Tell your health care provider if: ? You fall. Be sure to tell your health care provider about all falls, even ones that seem minor. ? You feel dizzy, sleepy, or off-balance.  Take over-the-counter and prescription medicines only as told by your health care provider. These include any supplements.  Eat a healthy diet and maintain a healthy weight. A healthy diet includes low-fat dairy products, low-fat (lean) meats, and fiber from whole grains, beans, and lots of fruits and vegetables. Home safety  Remove any tripping hazards, such as rugs, cords, and clutter.  Install safety equipment such as grab bars in bathrooms and safety rails on stairs.  Keep rooms and walkways well-lit. Activity   Follow a regular exercise program to stay fit. This will help you maintain your balance. Ask your health care provider what types of exercise are appropriate for you.  If you need a cane or   walker, use it as recommended by your health care provider.  Wear supportive shoes that have nonskid soles. Lifestyle  Do not drink alcohol if your health care provider tells you not to drink.  If you drink alcohol, limit how much you have: ? 0-1 drink a day for women. ? 0-2 drinks a day for men.  Be aware of how much alcohol is in your drink. In the [place] one drink equals one typical bottle of beer (12  oz), one-half glass of wine (5 oz), or one shot of hard liquor (1 oz).  Do not use any products that contain nicotine or tobacco, such as cigarettes and e-cigarettes. If you need help quitting, ask your health care provider. Summary  Having a healthy lifestyle and getting preventive care can help to protect your health and wellness after age 78.  Screening and testing are the best way to find a health problem early and help you avoid having a fall. Early diagnosis and treatment give you the best chance for managing medical conditions that are more common for people who are older than age 78.  Falls are a major cause of broken bones and head injuries in people who are older than age 78. Take precautions to prevent a fall at home.  Work with your health care provider to learn what changes you can make to improve your health and wellness and to prevent falls. This information is not intended to replace advice given to you by your health care provider. Make sure you discuss any questions you have with your health care provider. Document Released: 02/27/2017 Document Revised: 02/27/2017 Document Reviewed: 02/27/2017 Elsevier Interactive Patient Education  2019 [employer]  

## 2018-10-21 NOTE — Progress Notes (Signed)
Subjective:    Patient ID: Howard Ramos, male    DOB: [birth year], 78 y.o.   MRN: [id number]  Chief Complaint  Patient presents with  . Annual Exam   Pt presents to the office today for chronic follow up. Pt currently only taking Vit B vitamin. He is followed by an Urologists for prostate cancer. He is scheduled for a MRI next week for his prostate.   Pt denies any headache, palpitations, SOB, or edema at this time.   He is seen at the [place] annually. He has had his Shingles and Pneumonia vaccines. He states he gets low dose CT scan once a year for his hx of smoking. He quit smoking in 2009 after smoking 40-45 years.  Hyperlipidemia This is a chronic problem. The current episode started more than 1 year ago. The problem is uncontrolled. Exacerbating diseases include obesity. Current antihyperlipidemic treatment includes diet change. The current treatment provides moderate improvement of lipids. Risk factors for coronary artery disease include dyslipidemia, male sex and a sedentary lifestyle.  Rash This is a new problem. The current episode started 1 to 4 weeks ago. The problem has been waxing and waning since onset. The affected locations include the right axilla. The rash is characterized by redness and itchiness. He was exposed to nothing. Treatments tried: alcohol. The treatment provided no relief.       Review of Systems  Skin: Positive for rash.  All other systems reviewed and are negative.   Family History  Problem Relation Age of Onset  . Hypertension Mother   . Cancer Father        Pancreatic    Social History   Socioeconomic History  . Marital status: Married    Spouse name: Howard Ramos  . Number of children: 3  . Years of education: 78  . Highest education level: Some college, no degree  Occupational History  . Occupation: retired    Comment: part-time as Dealer, painting  Social Needs  . Financial resource strain: Not hard at all  . Food insecurity    Worry: Never  true    Inability: Never true  . Transportation needs    Medical: No    Non-medical: No  Tobacco Use  . Smoking status: Former Smoker    Packs/day: 1.00    Years: 40.00    Pack years: 40.00    Types: Cigarettes    Quit date: 10/24/2007    Years since quitting: 11.0  . Smokeless tobacco: Never Used  Substance and Sexual Activity  . Alcohol use: No  . Drug use: No  . Sexual activity: Not Currently    Birth control/protection: None  Lifestyle  . Physical activity    Days per week: 0 days    Minutes per session: 0 min  . Stress: Not at all  Relationships  . Social connections    Talks on phone: More than three times a week    Gets together: More than three times a week    Attends religious service: More than 4 times per year    Active member of club or organization: Yes    Attends meetings of clubs or organizations: More than 4 times per year    Relationship status: Married  Other Topics Concern  . Not on file  Social History Narrative  . Not on file       Objective:   Physical Exam Vitals signs reviewed.  Constitutional:      General: He is not in acute  distress.    Appearance: He is well-developed.  HENT:     Head: Normocephalic.     Right Ear: Tympanic membrane normal.     Left Ear: Tympanic membrane normal.  Eyes:     General:        Right eye: No discharge.        Left eye: No discharge.     Pupils: Pupils are equal, round, and reactive to light.  Neck:     Musculoskeletal: Normal range of motion and neck supple.     Thyroid: No thyromegaly.  Cardiovascular:     Rate and Rhythm: Normal rate and regular rhythm.     Heart sounds: Normal heart sounds. No murmur.  Pulmonary:     Effort: Pulmonary effort is normal. No respiratory distress.     Breath sounds: Normal breath sounds. No wheezing.  Abdominal:     General: Bowel sounds are normal. There is no distension.     Palpations: Abdomen is soft.     Tenderness: There is no abdominal tenderness.   Musculoskeletal: Normal range of motion.        General: No tenderness.  Skin:    General: Skin is warm and dry.     Findings: No erythema or rash.  Neurological:     Mental Status: He is alert and oriented to person, place, and time.     Cranial Nerves: No cranial nerve deficit.     Deep Tendon Reflexes: Reflexes are normal and symmetric.  Psychiatric:        Behavior: Behavior normal.        Thought Content: Thought content normal.        Judgment: Judgment normal.       BP (!) 147/90   Pulse 65   Temp (!) 96.8 F (36 C) (Oral)   Ht _0  (1.778 m)   Wt 200 lb 6.4 oz (90.9 kg)   BMI 28.75 kg/m      Assessment & Plan:  Howard Ramos comes in today with chief complaint of Annual Exam   Diagnosis and orders addressed:  1. Prostate cancer ([place]) - CMP14+EGFR - CBC with Differential/Platelet  2. Overweight (BMI 25.0-29.9) - CMP14+EGFR - CBC with Differential/Platelet  3. Hyperlipidemia, unspecified hyperlipidemia type - CMP14+EGFR - CBC with Differential/Platelet - Lipid panel  4. Skin yeast infection Keep clean and dry Do not scratch - nystatin (MYCOSTATIN/NYSTOP) powder; Apply topically 4 (four) times daily.  Dispense: 15 g; Refill: 0 - nystatin ointment (MYCOSTATIN); Apply 1 application topically 2 (two) times daily.  Dispense: 30 g; Refill: 0 - CMP14+EGFR - CBC with Differential/Platelet   Labs pending Health Maintenance reviewed Diet and exercise encouraged  Follow up plan: 1 year    Evelina Dun, FNP

## 2018-10-22 LAB — CMP14+EGFR
ALT: 12 IU/L (ref 0–44)
AST: 11 IU/L (ref 0–40)
Albumin/Globulin Ratio: 2.1 (ref 1.2–2.2)
Albumin: 4.2 g/dL (ref 3.7–4.7)
Alkaline Phosphatase: 68 IU/L (ref 39–117)
BUN/Creatinine Ratio: 13 (ref 10–24)
BUN: 15 mg/dL (ref 8–27)
Bilirubin Total: 0.5 mg/dL (ref 0.0–1.2)
CO2: 26 mmol/L (ref 20–29)
Calcium: 9.4 mg/dL (ref 8.6–10.2)
Chloride: 100 mmol/L (ref 96–106)
Creatinine, Ser: 1.15 mg/dL (ref 0.76–1.27)
GFR calc Af Amer: 71 mL/min/{1.73_m2} (ref >59)
GFR calc non Af Amer: 61 mL/min/{1.73_m2} (ref >59)
Globulin, Total: 2 g/dL (ref 1.5–4.5)
Glucose: 88 mg/dL (ref 65–99)
Potassium: 4.6 mmol/L (ref 3.5–5.2)
Sodium: 141 mmol/L (ref 134–144)
Total Protein: 6.2 g/dL (ref 6.0–8.5)

## 2018-10-22 LAB — CBC WITH DIFFERENTIAL/PLATELET
Basophils Absolute: 0.1 10*3/uL (ref 0.0–0.2)
Basos: 1 %
EOS (ABSOLUTE): 0.2 10*3/uL (ref 0.0–0.4)
Eos: 3 %
Hematocrit: 47.2 % (ref 37.5–51.0)
Hemoglobin: 15.6 g/dL (ref 13.0–17.7)
Immature Grans (Abs): 0 10*3/uL (ref 0.0–0.1)
Immature Granulocytes: 0 %
Lymphocytes Absolute: 3.3 10*3/uL — ABNORMAL HIGH (ref 0.7–3.1)
Lymphs: 41 %
MCH: 29.8 pg (ref 26.6–33.0)
MCHC: 33.1 g/dL (ref 31.5–35.7)
MCV: 90 fL (ref 79–97)
Monocytes Absolute: 0.6 10*3/uL (ref 0.1–0.9)
Monocytes: 8 %
Neutrophils Absolute: 3.8 10*3/uL (ref 1.4–7.0)
Neutrophils: 47 %
Platelets: 219 10*3/uL (ref 150–450)
RBC: 5.23 x10E6/uL (ref 4.14–5.80)
RDW: 12.5 % (ref 11.6–15.4)
WBC: 8.1 10*3/uL (ref 3.4–10.8)

## 2018-10-22 LAB — LIPID PANEL
Chol/HDL Ratio: 6.7 ratio — ABNORMAL HIGH (ref 0.0–5.0)
Cholesterol, Total: 227 mg/dL — ABNORMAL HIGH (ref 100–199)
HDL: 34 mg/dL — ABNORMAL LOW (ref >39)
LDL Calculated: 138 mg/dL — ABNORMAL HIGH (ref 0–99)
Triglycerides: 277 mg/dL — ABNORMAL HIGH (ref 0–149)
VLDL Cholesterol Cal: 55 mg/dL — ABNORMAL HIGH (ref 5–40)

## 2018-10-23 ENCOUNTER — Other Ambulatory Visit: Payer: Self-pay | Admitting: Family

## 2018-10-23 MED ORDER — ATORVASTATIN CALCIUM 20 MG PO TABS: 20.0000 mg | ORAL_TABLET | Freq: Every day | ORAL | 2 refills | Status: DC

## 2018-10-28 ENCOUNTER — Ambulatory Visit
Admission: RE | Admit: 2018-10-28 | Discharge: 2018-10-28 | Disposition: A | Discharge status: Home / Self Care | Payer: Medicare Other | Source: Ambulatory Visit | Attending: Urology | Admitting: Urology

## 2018-10-28 DIAGNOSIS — C61 Malignant neoplasm of prostate: Secondary | ICD-10-CM

## 2018-10-28 IMAGING — MR MR PROSTATE WO/W CM
56 series · 56 of 56 positions shown · IV contrast (multihance)
Comparison: None.

CLINICAL DATA: Prostate cancer, PSA of 7.2 on [DATE]. Gleason
3+3=6 adenocarcinoma in 10% of 1 core at the right lateral base on
biopsy of [DATE].

EXAM:
MR PROSTATE WITHOUT AND WITH CONTRAST
TECHNIQUE: Multiplanar multisequence MRI images were obtained of the pelvis
centered about the prostate. Pre and post contrast images were
obtained.
CONTRAST:  19mL MULTIHANCE GADOBENATE DIMEGLUMINE 529 MG/ML IV SOLN

[Series 3: T1 · axial · 8.0mm · 1.06mm/px · 1 of 28 slices shown (1 of 2)]
[im 1/28]
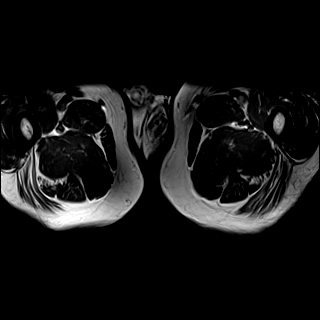

[Series 4: bSSFP fat-sat · axial · 8.0mm · 0.74mm/px · 1 of 28 slices shown]
[im 1/28]
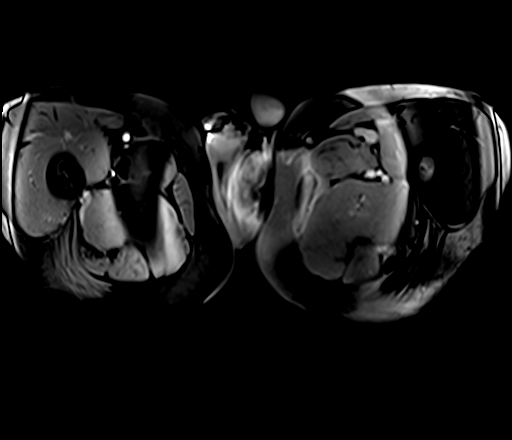

[Series 5: T2 · sagittal · 3.5mm · 0.56mm/px · 1 of 39 slices shown (1 of 4)]
[im 1/39]
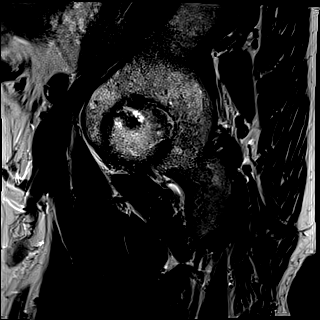

[Series 6: T1 · axial · 3.0mm · 0.31mm/px · 1 of 24 slices shown (2 of 2)]
[im 1/24]
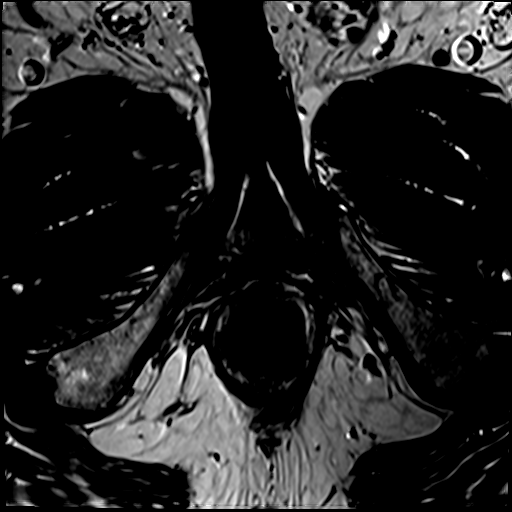

[Series 7: T2 · axial · 3.5mm · 0.56mm/px · 1 of 23 slices shown (2 of 4)]
[im 1/23]
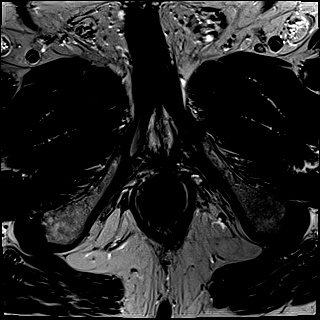

[Series 8: T2 · axial · 1.0mm · 1.04mm/px · 1 of 80 slices shown (3 of 4)]
[im 1/80]
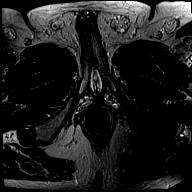

[Series 9: T2 · coronal · 3.5mm · 0.56mm/px · 1 of 23 slices shown (4 of 4)]
[im 1/23]
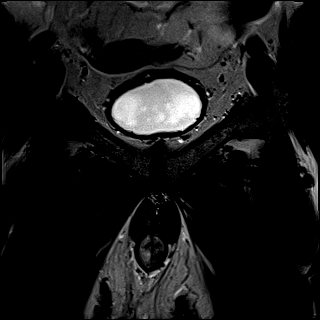

[Series 10: DWI · axial · 3.5mm · 1.56mm/px · 1 of 60 slices shown (1 of 2)]
[im 1/60]
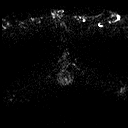

[Series 11: DWI · axial · 3.5mm · 1.56mm/px · 1 of 20 slices shown (2 of 2)]
[im 1/20]
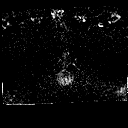

[Series 12: pre t1_twist_tra_dyn_ttc=5.3s · axial · non-contrast · 3.5mm · 0.83mm/px · 1 of 20 slices shown]
[im 1/20]
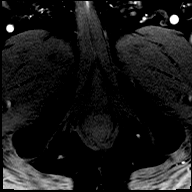

[Series 13: post t1_twist_tra_dyn-copy center · axial · 3.5mm · 0.83mm/px · 1 of 20 slices shown (1 of 24)]
[im 1/20]
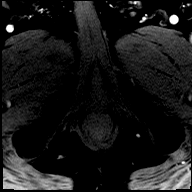

[Series 14: post t1_twist_tra_dyn-copy center · axial · 3.5mm · 0.83mm/px · 1 of 20 slices shown (2 of 24)]
[im 1/20]
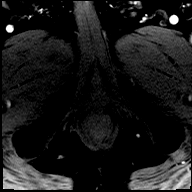

[Series 15: post t1_twist_tra_dyn-copy cent_sub_ttc=(id) · axial · 3.5mm · 0.83mm/px · 1 of 17 slices shown (1 of 22)]
[im 1/17]
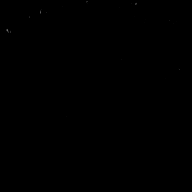

[Series 16: post t1_twist_tra_dyn-copy center · axial · 3.5mm · 0.83mm/px · 1 of 20 slices shown (3 of 24)]
[im 1/20]
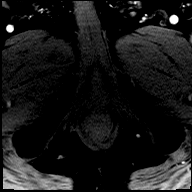

[Series 17: post t1_twist_tra_dyn-copy cent_sub_ttc=(id) · axial · 3.5mm · 0.83mm/px · 1 of 16 slices shown (2 of 22)]
[im 1/16]
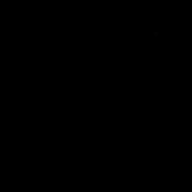

[Series 18: post t1_twist_tra_dyn-copy center · axial · 3.5mm · 0.83mm/px · 1 of 20 slices shown (4 of 24)]
[im 1/20]
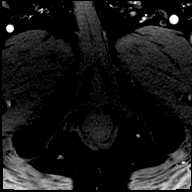

[Series 19: post t1_twist_tra_dyn-copy cent_sub_ttc=(id) · axial · 3.5mm · 0.83mm/px · 1 of 17 slices shown (3 of 22)]
[im 1/17]
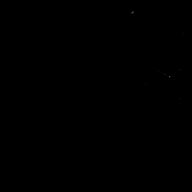

[Series 20: post t1_twist_tra_dyn-copy center · axial · 3.5mm · 0.83mm/px · 1 of 20 slices shown (5 of 24)]
[im 1/20]
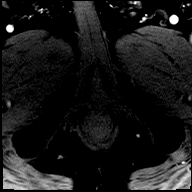

[Series 21: post t1_twist_tra_dyn-copy cent_sub_ttc=(id) · axial · 3.5mm · 0.83mm/px · 1 of 19 slices shown (4 of 22)]
[im 1/19]
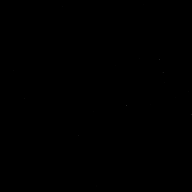

[Series 22: post t1_twist_tra_dyn-copy center · axial · 3.5mm · 0.83mm/px · 1 of 20 slices shown (6 of 24)]
[im 1/20]
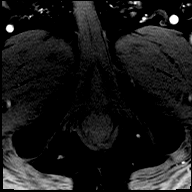

[Series 23: post t1_twist_tra_dyn-copy cent_sub_ttc=(id) · axial · 3.5mm · 0.83mm/px · 1 of 17 slices shown (5 of 22)]
[im 1/17]
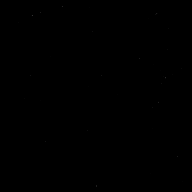

[Series 24: post t1_twist_tra_dyn-copy center · axial · 3.5mm · 0.83mm/px · 1 of 20 slices shown (7 of 24)]
[im 1/20]
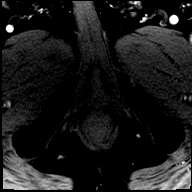

[Series 25: post t1_twist_tra_dyn-copy cent_sub_ttc=(id) · axial · 3.5mm · 0.83mm/px · 1 of 20 slices shown (6 of 22)]
[im 1/20]
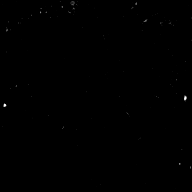

[Series 26: post t1_twist_tra_dyn-copy center · axial · 3.5mm · 0.83mm/px · 1 of 20 slices shown (8 of 24)]
[im 1/20]
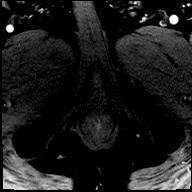

[Series 27: post t1_twist_tra_dyn-copy cent_sub_ttc=(id) · axial · 3.5mm · 0.83mm/px · 1 of 20 slices shown (7 of 22)]
[im 1/20]
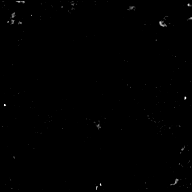

[Series 28: post t1_twist_tra_dyn-copy center · axial · 3.5mm · 0.83mm/px · 1 of 20 slices shown (9 of 24)]
[im 1/20]
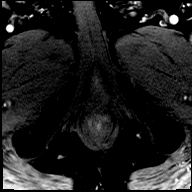

[Series 29: post t1_twist_tra_dyn-copy cent_sub_ttc=(id) · axial · 3.5mm · 0.83mm/px · 1 of 20 slices shown (8 of 22)]
[im 1/20]
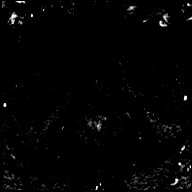

[Series 30: post t1_twist_tra_dyn-copy center · axial · 3.5mm · 0.83mm/px · 1 of 20 slices shown (10 of 24)]
[im 1/20]
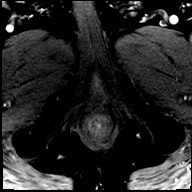

[Series 31: post t1_twist_tra_dyn-copy cent_sub_ttc=(id) · axial · 3.5mm · 0.83mm/px · 1 of 20 slices shown (9 of 22)]
[im 1/20]
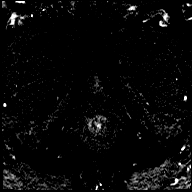

[Series 32: post t1_twist_tra_dyn-copy center · axial · 3.5mm · 0.83mm/px · 1 of 20 slices shown (11 of 24)]
[im 1/20]
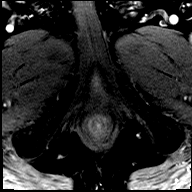

[Series 33: post t1_twist_tra_dyn-copy cent_sub_ttc=(id) · axial · 3.5mm · 0.83mm/px · 1 of 20 slices shown (10 of 22)]
[im 1/20]
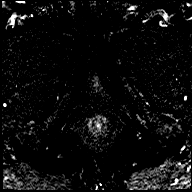

[Series 34: post t1_twist_tra_dyn-copy center · axial · 3.5mm · 0.83mm/px · 1 of 20 slices shown (12 of 24)]
[im 1/20]
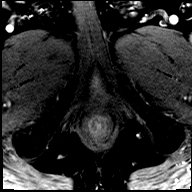

[Series 35: post t1_twist_tra_dyn-copy cent_sub_ttc=(id) · axial · 3.5mm · 0.83mm/px · 1 of 20 slices shown (11 of 22)]
[im 1/20]
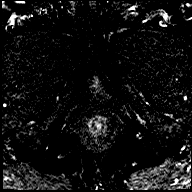

[Series 36: post t1_twist_tra_dyn-copy center · axial · 3.5mm · 0.83mm/px · 1 of 20 slices shown (13 of 24)]
[im 1/20]
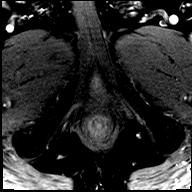

[Series 37: post t1_twist_tra_dyn-copy cent_sub_ttc=(id) · axial · 3.5mm · 0.83mm/px · 1 of 20 slices shown (12 of 22)]
[im 1/20]
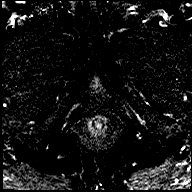

[Series 38: post t1_twist_tra_dyn-copy center · axial · 3.5mm · 0.83mm/px · 1 of 20 slices shown (14 of 24)]
[im 1/20]
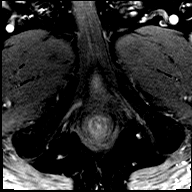

[Series 39: post t1_twist_tra_dyn-copy cent_sub_ttc=(id) · axial · 3.5mm · 0.83mm/px · 1 of 20 slices shown (13 of 22)]
[im 1/20]
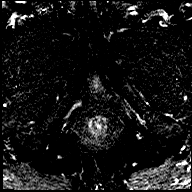

[Series 40: post t1_twist_tra_dyn-copy center · axial · 3.5mm · 0.83mm/px · 1 of 20 slices shown (15 of 24)]
[im 1/20]
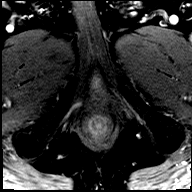

[Series 41: post t1_twist_tra_dyn-copy cent_sub_ttc=(id) · axial · 3.5mm · 0.83mm/px · 1 of 20 slices shown (14 of 22)]
[im 1/20]
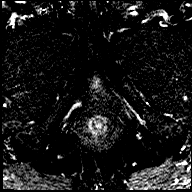

[Series 42: post t1_twist_tra_dyn-copy center · axial · 3.5mm · 0.83mm/px · 1 of 20 slices shown (16 of 24)]
[im 1/20]
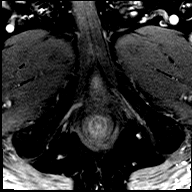

[Series 43: post t1_twist_tra_dyn-copy cent_sub_ttc=(id) · axial · 3.5mm · 0.83mm/px · 1 of 20 slices shown (15 of 22)]
[im 1/20]
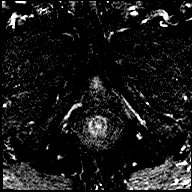

[Series 44: post t1_twist_tra_dyn-copy center · axial · 3.5mm · 0.83mm/px · 1 of 20 slices shown (17 of 24)]
[im 1/20]
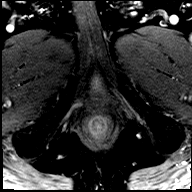

[Series 45: post t1_twist_tra_dyn-copy cent_sub_ttc=(id) · axial · 3.5mm · 0.83mm/px · 1 of 20 slices shown (16 of 22)]
[im 1/20]
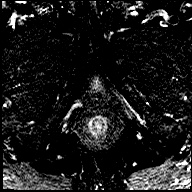

[Series 46: post t1_twist_tra_dyn-copy center · axial · 3.5mm · 0.83mm/px · 1 of 20 slices shown (18 of 24)]
[im 1/20]
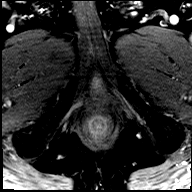

[Series 47: post t1_twist_tra_dyn-copy cent_sub_ttc=(id) · axial · 3.5mm · 0.83mm/px · 1 of 20 slices shown (17 of 22)]
[im 1/20]
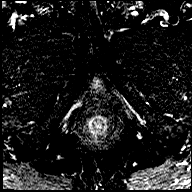

[Series 48: post t1_twist_tra_dyn-copy center · axial · 3.5mm · 0.83mm/px · 1 of 20 slices shown (19 of 24)]
[im 1/20]
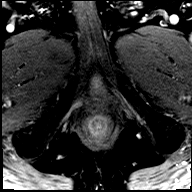

[Series 49: post t1_twist_tra_dyn-copy cent_sub_ttc=(id) · axial · 3.5mm · 0.83mm/px · 1 of 20 slices shown (18 of 22)]
[im 1/20]
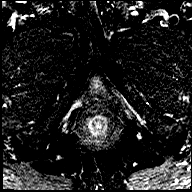

[Series 50: post t1_twist_tra_dyn-copy center · axial · 3.5mm · 0.83mm/px · 1 of 20 slices shown (20 of 24)]
[im 1/20]
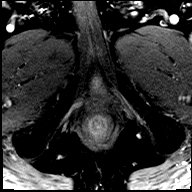

[Series 51: post t1_twist_tra_dyn-copy cent_sub_ttc=(id) · axial · 3.5mm · 0.83mm/px · 1 of 20 slices shown (19 of 22)]
[im 1/20]
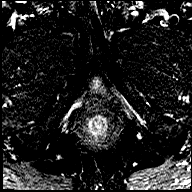

[Series 52: post t1_twist_tra_dyn-copy center · axial · 3.5mm · 0.83mm/px · 1 of 20 slices shown (21 of 24)]
[im 1/20]
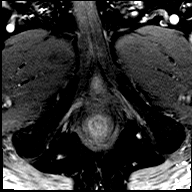

[Series 53: post t1_twist_tra_dyn-copy cent_sub_ttc=(id) · axial · 3.5mm · 0.83mm/px · 1 of 20 slices shown (20 of 22)]
[im 1/20]
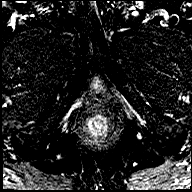

[Series 54: post t1_twist_tra_dyn-copy center · axial · 3.5mm · 0.83mm/px · 1 of 20 slices shown (22 of 24)]
[im 1/20]
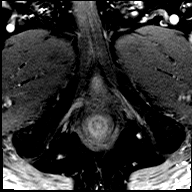

[Series 55: post t1_twist_tra_dyn-copy cent_sub_ttc=(id) · axial · 3.5mm · 0.83mm/px · 1 of 20 slices shown (21 of 22)]
[im 1/20]
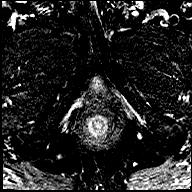

[Series 56: post t1_twist_tra_dyn-copy center · axial · 3.5mm · 0.83mm/px · 1 of 20 slices shown (23 of 24)]
[im 1/20]
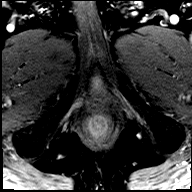

[Series 57: post t1_twist_tra_dyn-copy cent_sub_ttc=(id) · axial · 3.5mm · 0.83mm/px · 1 of 20 slices shown (22 of 22)]
[im 1/20]
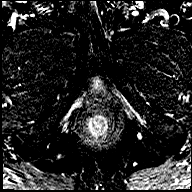

[Series 58: post t1_twist_tra_dyn-copy center · axial · 3.5mm · 0.83mm/px · 1 of 20 slices shown (24 of 24)]
[im 1/20]
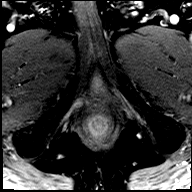

[56 of 56 positions shown; findings below may reference images not displayed]

FINDINGS: Prostate: Region of interest # 1: PI-RADS category 4 lesion of the
right posterolateral peripheral zone in the mid gland and apex,
extending up towards the posterior peripheral zone of the base, with
reduced T2 signal, some faintly reduced areas of reduced ADC map
activity, and dynamic early contrast enhancement. Lesion volume
cubic cm (1.8 by 1.1 by 1.4 cm).

Region of interest # 2: PI-RADS category 3 left apical
posterolateral peripheral zone lesion with reduced ADC map activity
but no dynamic enhancement. This lesion measures 0.17 cubic cm (0.8
by 0.5 by 0.5 cm).

Volume: 3D volumetric analysis: Prostate volume 35.35 cubic cm (5.2
by 3.6 by 3.6 cm).

Transcapsular spread:  Absent

Seminal vesicle involvement: Absent

Neurovascular bundle involvement: Absent

Pelvic adenopathy: Absent

Bone metastasis: Absent

Other findings: Small bilateral groin hernias containing adipose
tissue.
IMPRESSION: 1. PI-RADS category 4 lesion of the right peripheral zone and a
small PI-RADS category 3 lesion of the left apical peripheral zone
noted and detailed above. Appropriate targeting data sent to [facility].

## 2018-10-28 MED ORDER — GADOBENATE DIMEGLUMINE 529 MG/ML IV SOLN
19.0000 mL | Freq: Once | INTRAVENOUS | Status: AC | PRN
  Administered 2018-10-28: 19 mL via INTRAVENOUS

## 2018-12-15 DIAGNOSIS — C61 Malignant neoplasm of prostate: Secondary | ICD-10-CM | POA: Diagnosis not present

## 2019-04-08 DIAGNOSIS — B078 Other viral warts: Secondary | ICD-10-CM | POA: Diagnosis not present

## 2019-04-08 DIAGNOSIS — L821 Other seborrheic keratosis: Secondary | ICD-10-CM | POA: Diagnosis not present

## 2019-05-04 ENCOUNTER — Other Ambulatory Visit: Payer: Self-pay

## 2019-05-04 DIAGNOSIS — C61 Malignant neoplasm of prostate: Secondary | ICD-10-CM

## 2019-06-01 ENCOUNTER — Other Ambulatory Visit: Payer: Self-pay | Admitting: Urology

## 2019-06-02 LAB — PSA: PSA: 7.7 ng/mL — ABNORMAL HIGH (ref <=4.0)

## 2019-06-09 ENCOUNTER — Encounter: Payer: Self-pay | Admitting: Urology

## 2019-06-09 ENCOUNTER — Other Ambulatory Visit: Payer: Self-pay

## 2019-06-09 ENCOUNTER — Ambulatory Visit: Payer: Medicare Other | Admitting: Urology

## 2019-06-09 VITALS — BP 149/68 | HR 71 | Temp 97.2°F | Ht 70.0 in | Wt 200.0 lb

## 2019-06-09 DIAGNOSIS — C61 Malignant neoplasm of prostate: Secondary | ICD-10-CM | POA: Diagnosis not present

## 2019-06-09 NOTE — Progress Notes (Addendum)
Chief Complaint: Prostate Cancer  History of Present Illness:   2.9.2021: Most recent PSA was 7.7 on 2.1.2021. Here today for follow-up reporting improved urinary sx's since his last bx . The change he most happy with is his now  nocturia.   IPSS Questionnaire (AUA-7): Over the past month.   1)  How often have you had a sensation of not emptying your bladder completely after you finish urinating?  1 - Less than 1 time in 5  2)  How often have you had to urinate again less than two hours after you finished urinating? 1 - Less than 1 time in 5  3)  How often have you found you stopped and started again several times when you urinated?  0 - Not at all  4) How difficult have you found it to postpone urination?  1 - Less than 1 time in 5  5) How often have you had a weak urinary stream?  0 - Not at all  6) How often have you had to push or strain to begin urination?  0 - Not at all  7) How many times did you most typically get up to urinate from the time you went to bed until the time you got up in the morning?  1 - 1 time  Total score:  0-7 mildly symptomatic   8-19 moderately symptomatic   20-35 severely symptomatic  IPSS: 4 QoL: 2  (below copied from [facility] records):  Prostate Cancer:  Howard Ramos is a 79 year-old male established patient who is here evaluation for treatment of prostate cancer.  His prostate cancer was diagnosed 09/04/2016. He does have the pathology report from his biopsy. His PSA at his time of diagnosis was 5.7. His most recent PSA is 7.2.   This man underwent TRUS/Bx on 5.8.2018. At that time, PSA was 5.7, prostatic volume 42 mL, PSA density 0.14. 1/12 cores positive for adenocarcinoma, GS 3+3 pattern. The positive core was at the right base lateral, 10% was involved with cancer.   He decided on active surveillance.   1.14.2020: Most recent PSA 7.2 (1.6.2020). He has had no recent urologic issues. His voiding is still excellent.   6.30.2020: Prostate MRI--volume  35.35 ml. No evidence of trans capsular/seminal vesicle/neurovascular bundle involvement, no evidence of pelvic adenopathy or bony metastatic disease. 2 ROIs--  #1--PIRADS 4 lesion Rt posterolateral peropheral zone 18x11x14 ml.  #2--PIRADS 3 lesion Lt apical posterolateral peripheral zone 8x5x5 ml.   8.17.2020: Here for fusion TRUS/Bx. Prostate volume 30 ml. 1/12 systematic cores + for PCA--GS 3+3 in 20% of core from Rt apex medial. 6 cores from 2 ROI's all benign.  Only 1/12 cores POS for cancer (3+4 20%)   05/05/18 04/01/17 07/04/16 02/20/16 01/17/16 12/22/15 02/02/15 09/22/13  PSA  Total PSA 7.2 ng/dl 6.5 ng/dl 5.7 ng/dl 5.3  5.7  8.0 ng/dl 6.48 ng/dl 3.51 ng/dl  % Free PSA      12 %      Past Medical History:  Diagnosis Date  . Cancer [facility])    Prostate  . Diabetes mellitus without complication (HCC)    elevated hgb A1C- diet controlled  . Hyperlipidemia     Past Surgical History:  Procedure Laterality Date  . CATARACT EXTRACTION W/PHACO Left 10/29/2013   Procedure: CATARACT EXTRACTION PHACO AND INTRAOCULAR LENS PLACEMENT (IOC);  Surgeon: Tonny Branch, MD;  Location: [facility];  Service: Ophthalmology;  Laterality: Left;  CDE:  15.52  . CATARACT EXTRACTION W/PHACO Right 12/07/2013  Procedure: CATARACT EXTRACTION PHACO AND INTRAOCULAR LENS PLACEMENT (IOC);  Surgeon: Tonny Branch, MD;  Location: [facility];  Service: Ophthalmology;  Laterality: Right;  CDE 16.99  . ROTATOR CUFF REPAIR Right     Home Medications:  Allergies as of 06/09/2019   No Known Allergies      Medication List        Accurate as of June 09, 2019  3:52 PM. If you have any questions, ask your nurse or doctor.          atorvastatin 20 MG tablet Commonly known as: Lipitor Take 1 tablet (20 mg total) by mouth daily.   b complex vitamins tablet Take 1 tablet by mouth daily.   nystatin powder Commonly known as: MYCOSTATIN/NYSTOP Apply topically 4 (four) times daily.   nystatin ointment Commonly known as:  MYCOSTATIN Apply 1 application topically 2 (two) times daily.        Allergies: No Known Allergies  Family History  Problem Relation Age of Onset  . Hypertension Mother   . Cancer Father        Pancreatic     Social History:  reports that he quit smoking about 11 years ago. His smoking use included cigarettes. He has a 40.00 pack-year smoking history. He has never used smokeless tobacco. He reports that he does not drink alcohol or use drugs.  ROS: A complete review of systems was performed.  All systems are negative except for pertinent findings as noted.  Physical Exam:  Vital signs in last 24 hours: BP (!) 149/68   Pulse 71   Temp (!) 97.2 F (36.2 C)   Ht 5\' 10"  (1.778 m)   Wt 200 lb (90.7 kg)   BMI 28.70 kg/m  Constitutional:  Alert and oriented, No acute distress Cardiovascular: Regular rate  Respiratory: Normal respiratory effort GI: Abdomen is soft, nontender, nondistended, no abdominal masses. No CVAT.  Genitourinary: Normal male phallus, testes are descended bilaterally and non-tender and without masses, scrotum is normal in appearance without lesions or masses, perineum is normal on inspection. Prostate feels around 30 grams in size.  Lymphatic: No lymphadenopathy Neurologic: Grossly intact, no focal deficits Psychiatric: Normal mood and affect  Laboratory Data:  No results for input(s): WBC, HGB, HCT, PLT in the last 72 hours.  No results for input(s): NA, K, CL, GLUCOSE, BUN, CALCIUM, CREATININE in the last 72 hours.  Invalid input(s): CO3   No results found for this or any previous visit (from the past 24 hour(s)). No results found for this or any previous visit (from the past 240 hour(s)).  Renal Function: No results for input(s): CREATININE in the last 168 hours. CrCl cannot be calculated (Patient's most recent lab result is older than the maximum 21 days allowed.).   I have reviewed prior pt notes  I have reviewed notes from  referring/previous physicians  I have independently reviewed prior imaging  I have reviewed prior PSA results   Impression/Assessment:  His PSA is slightly elevated but within range of his PSA's over the last few years. Prostate exam normal. His urinary sx's are significantly improved following resection of prostate tissue during recent bx.   Plan:  Return q 6 mo for follow-up w/ PSA  Probable repeat MRI/fusion bx in 2-3 yrs

## 2019-06-09 NOTE — Progress Notes (Addendum)
Urological Symptom Review  Patient is experiencing the following symptoms: none   Review of Systems  Gastrointestinal (upper)  : Negative for upper GI symptoms  Gastrointestinal (lower) : Negative for lower GI symptoms  Constitutional : Negative for symptoms  Skin: Negative for skin symptoms  Eyes: Negative for eye symptoms  Ear/Nose/Throat : Negative for Ear/Nose/Throat symptoms  Hematologic/Lymphatic: Easy bruising  Cardiovascular : Negative for cardiovascular symptoms  Respiratory : Negative for respiratory symptoms  Endocrine: Negative for endocrine symptoms  Musculoskeletal: Negative for musculoskeletal symptoms  Neurological: Negative for neurological symptoms  Psychologic: Negative for psychiatric symptoms  

## 2019-10-19 ENCOUNTER — Ambulatory Visit (INDEPENDENT_AMBULATORY_CARE_PROVIDER_SITE_OTHER): Payer: Medicare Other | Admitting: *Deleted

## 2019-10-19 DIAGNOSIS — Z0000 Encounter for general adult medical examination without abnormal findings: Secondary | ICD-10-CM

## 2019-10-19 NOTE — Patient Instructions (Signed)
°  [facility] Maintenance Summary and Written Plan of Care  Mr. Howard Ramos ,  Thank you for allowing me to perform your Medicare Annual Wellness Visit and for your ongoing commitment to your health.   Health Maintenance & Immunization History Health Maintenance  Topic Date Due   Hepatitis C Screening  Never done   INFLUENZA VACCINE  11/29/2019   TETANUS/TDAP  09/23/2023   COVID-19 Vaccine  Completed   PNA vac Low Risk Adult  Completed   Immunization History  Administered Date(s) Administered   Meningococcal Conjugate 12/28/2015   Meningococcal Polysaccharide 12/27/2016   Moderna SARS-COVID-2 Vaccination 05/21/2019, 06/18/2019   Tdap 09/22/2013    These are the patient goals that we discussed: Goals Addressed            This Visit's Progress    AWV       10/19/2019 AWV Goal: Exercise for General Health   Patient will verbalize understanding of the benefits of increased physical activity:  Exercising regularly is important. It will improve your overall fitness, flexibility, and endurance.  Regular exercise also will improve your overall health. It can help you control your weight, reduce stress, and improve your bone density.  Over the next year, patient will increase physical activity as tolerated with a goal of at least 150 minutes of moderate physical activity per week.   You can tell that you are exercising at a moderate intensity if your heart starts beating faster and you start breathing faster but can still hold a conversation.  Moderate-intensity exercise ideas include:  Walking 1 mile (1.6 km) in about 15 minutes  Biking  Hiking  Golfing  Dancing  Water aerobics  Patient will verbalize understanding of everyday activities that increase physical activity by providing examples like the following: ? Yard work, such as: ? Pushing a Conservation officer, nature ? Raking and bagging leaves ? Washing your car ? Pushing a  stroller ? Shoveling snow ? Gardening ? Washing windows or floors  Patient will be able to explain general safety guidelines for exercising:   Before you start a new exercise program, talk with your health care provider.  Do not exercise so much that you hurt yourself, feel dizzy, or get very short of breath.  Wear comfortable clothes and wear shoes with good support.  Drink plenty of water while you exercise to prevent dehydration or heat stroke.  Work out until your breathing and your heartbeat get faster.         This is a list of Health Maintenance Items that are overdue or due now: Health Maintenance Due  Topic Date Due   Hepatitis C Screening  Never done     Orders/Referrals Placed Today: No orders of the defined types were placed in this encounter.  (Contact our referral department at [phone] if you have not spoken with someone about your referral appointment within the next 5 days)    Follow-up Plan  Follow-up with Sharion Balloon, FNP as planned  Hep C screening at next visit  AVS printed and mailed to patient

## 2019-10-19 NOTE — Progress Notes (Addendum)
MEDICARE ANNUAL WELLNESS VISIT  10/19/2019  Telephone Visit Disclaimer This Medicare AWV was conducted by telephone due to national recommendations for restrictions regarding the COVID-19 Pandemic (e.g. social distancing).  I verified, using two identifiers, that I am speaking with Howard Ramos or their authorized healthcare agent. I discussed the limitations, risks, security, and privacy concerns of performing an evaluation and management service by telephone and the potential availability of an in-person appointment in the future. The patient expressed understanding and agreed to proceed.   Subjective:  Howard Ramos is a 79 y.o. male patient of Hawks, Theador Hawthorne, FNP who had a Medicare Annual Wellness Visit today via telephone. Howard Ramos is Retired and lives with their spouse. He has 3 children. He reports that he is socially active and does interact with friends/family regularly. He is minimally physically active and enjoys learning about history, yard work, and gardening.  Patient Care Team: Sharion Balloon, FNP as PCP - General (Family Medicine)  Franchot Gallo, MD Urology  Advanced Directives 10/19/2019 10/16/2018 10/23/2013  Does Patient Have a Medical Advance Directive? No No Patient does not have advance directive;Patient would not like information  Would patient like information on creating a medical advance directive? No - Patient declined No - Patient declined -  Pre-existing out of facility DNR order (yellow form or pink MOST form) - - No    Hospital Utilization Over the Past 12 Months: # of hospitalizations or ER visits: 0 # of surgeries: 0  Review of Systems    Patient reports that his overall health is unchanged compared to last year.  History obtained from chart review and the patient  Patient Reported Readings (BP, Pulse, CBG, Weight, etc) none  Pain Assessment Pain : No/denies pain     Current Medications & Allergies (verified) Allergies as of 10/19/2019   No  Known Allergies      Medication List        Accurate as of October 19, 2019  8:34 AM. If you have any questions, ask your nurse or doctor.          STOP taking these medications    atorvastatin 20 MG tablet Commonly known as: Lipitor   b complex vitamins tablet   nystatin ointment Commonly known as: MYCOSTATIN   nystatin powder Commonly known as: MYCOSTATIN/NYSTOP        History (reviewed): Past Medical History:  Diagnosis Date   Cancer ([place])    Prostate   Diabetes mellitus without complication ([place])    elevated hgb A1C- diet controlled   Hyperlipidemia    Past Surgical History:  Procedure Laterality Date   CATARACT EXTRACTION W/PHACO Left 10/29/2013   Procedure: CATARACT EXTRACTION PHACO AND INTRAOCULAR LENS PLACEMENT ([place]);  Surgeon: Tonny Branch, MD;  Location: [facility];  Service: Ophthalmology;  Laterality: Left;  CDE:  15.52   CATARACT EXTRACTION W/PHACO Right 12/07/2013   Procedure: CATARACT EXTRACTION PHACO AND INTRAOCULAR LENS PLACEMENT (IOC);  Surgeon: Tonny Branch, MD;  Location: [facility];  Service: Ophthalmology;  Laterality: Right;  CDE 16.99   ROTATOR CUFF REPAIR Right    Family History  Problem Relation Age of Onset   Hypertension Mother    Cancer Father        Pancreatic    Cancer Sister        Lymphoma   Social History   Socioeconomic History   Marital status: Married    Spouse name: [name]   Number of children: 3   Years of education:  13   Highest education level: Some college, no degree  Occupational History   Occupation: retired    Comment: part-time as Dealer, painting  Tobacco Use   Smoking status: Former Smoker    Packs/day: 1.00    Years: 40.00    Pack years: 40.00    Types: Cigarettes    Quit date: 10/24/2007    Years since quitting: 11.9   Smokeless tobacco: Never Used  Vaping Use   Vaping Use: Never used  Substance and Sexual Activity   Alcohol use: No   Drug use: No   Sexual activity: Not Currently    Birth  control/protection: None  Other Topics Concern   Not on file  Social History Narrative   Not on file   Social Determinants of Health   Financial Resource Strain: Low Risk    Difficulty of Paying Living Expenses: Not hard at all  Food Insecurity: No Food Insecurity   Worried About Charity fundraiser in the Last Year: Never true   [place] in the Last Year: Never true  Transportation Needs: No Transportation Needs   Lack of Transportation (Medical): No   Lack of Transportation (Non-Medical): No  Physical Activity: Inactive   Days of Exercise per Week: 0 days   Minutes of Exercise per Session: 0 min  Stress: No Stress Concern Present   Feeling of Stress : Not at all  Social Connections: Socially Integrated   Frequency of Communication with Friends and Family: More than three times a week   Frequency of Social Gatherings with Friends and Family: More than three times a week   Attends Religious Services: More than 4 times per year   Active Member of [employer] or Organizations: Yes   Attends Archivist Meetings: More than 4 times per year   Marital Status: Married    Activities of Daily Living In your present state of health, do you have any difficulty performing the following activities: 10/19/2019  Hearing? N  Vision? N  Comment wears reading glasses  Difficulty concentrating or making decisions? N  Walking or climbing stairs? N  Dressing or bathing? N  Doing errands, shopping? N  Preparing Food and eating ? N  Using the Toilet? N  In the past six months, have you accidently leaked urine? N  Do you have problems with loss of bowel control? N  Managing your Medications? N  Managing your Finances? N  Housekeeping or managing your Housekeeping? N  Some recent data might be hidden    Patient Education/ Literacy How often do you need to have someone help you when you read instructions, pamphlets, or other written materials from your doctor or pharmacy?: 1 -  Never What is the last grade level you completed in school?: 12th  Exercise Current Exercise Habits: The patient does not participate in regular exercise at present, Exercise limited by: None identified  Diet Patient reports consuming 3 meals a day and 1 snack(s) a day Patient reports that his primary diet is: Regular Patient reports that she does have regular access to food.   Depression Screen PHQ 2/9 Scores 10/19/2019 10/16/2018 07/03/2017 06/27/2017 06/26/2017 05/16/2017  PHQ - 2 Score 0 0 0 0 0 0     Fall Risk Fall Risk  10/19/2019 10/16/2018 07/03/2017 06/27/2017 06/26/2017  Falls in the past year? 0 0 No No No     Objective:  Howard Ramos seemed alert and oriented and he participated appropriately during our telephone visit.  Blood Pressure Weight BMI  BP Readings from Last 3 Encounters:  06/09/19 (!) 149/68  10/21/18 (!) 147/90  07/03/17 140/73   Wt Readings from Last 3 Encounters:  06/09/19 200 lb (90.7 kg)  10/21/18 200 lb 6.4 oz (90.9 kg)  07/03/17 206 lb (93.4 kg)   BMI Readings from Last 1 Encounters:  06/09/19 28.70 kg/m    *Unable to obtain current vital signs, weight, and BMI due to telephone visit type  Hearing/Vision  Random did not seem to have difficulty with hearing/understanding during the telephone conversation Reports that he has had a formal eye exam by an eye care professional within the past year Reports that he has not had a formal hearing evaluation within the past year *Unable to fully assess hearing and vision during telephone visit type  Cognitive Function: 6CIT Screen 10/19/2019 10/16/2018  What Year? 0 points 0 points  What month? 0 points 0 points  What time? 0 points 0 points  Count back from 20 0 points 0 points  Months in reverse 0 points 0 points  Repeat phrase 0 points 0 points  Total Score 0 0   (Normal:0-7, Significant for Dysfunction: >8)  Normal Cognitive Function Screening: Yes   Immunization & Health Maintenance  Record Immunization History  Administered Date(s) Administered   Meningococcal Conjugate 12/28/2015   Meningococcal Polysaccharide 12/27/2016   Tdap 09/22/2013    Health Maintenance  Topic Date Due   Hepatitis C Screening  Never done   COVID-19 Vaccine (1) Never done   INFLUENZA VACCINE  11/29/2019   TETANUS/TDAP  09/23/2023   PNA vac Low Risk Adult  Completed       Assessment  This is a routine wellness examination for [facility].  Health Maintenance: Due or Overdue Health Maintenance Due  Topic Date Due   Hepatitis C Screening  Never done   COVID-19 Vaccine (1) Never done    Howard Ramos does not need a referral for [employer] Assistance: Care Management:   no Social Work:    no Prescription Assistance:  no Nutrition/Diabetes Education:  no   Plan:  Personalized Goals Goals Addressed   None    Personalized Health Maintenance & Screening Recommendations  Hep C screening  Lung Cancer Screening Recommended: no (Low Dose CT Chest recommended if Age 41-80 years, 30 pack-year currently smoking OR have quit w/in past 15 years) Hepatitis C Screening recommended: yes HIV Screening recommended: no  Advanced Directives: Written information was not prepared per patient's request.  Referrals & Orders No orders of the defined types were placed in this encounter.   Follow-up Plan Follow-up with Sharion Balloon, FNP as planned Hep C screening at next visit AVS printed and mailed to patient   I have personally reviewed and noted the following in the patient's chart:   Medical and social history Use of alcohol, tobacco or illicit drugs  Current medications and supplements Functional ability and status Nutritional status Physical activity Advanced directives List of other physicians Hospitalizations, surgeries, and ER visits in previous 12 months Vitals Screenings to include cognitive, depression, and falls Referrals and appointments  In addition, I have  reviewed and discussed with Howard Ramos certain preventive protocols, quality metrics, and best practice recommendations. A written personalized care plan for preventive services as well as general preventive health recommendations is available and can be mailed to the patient at his request.      Lynnea Ferrier, LPN  08/24/621  I have reviewed and agree with the above AWV  documentation.   Evelina Dun, FNP

## 2019-10-23 ENCOUNTER — Encounter: Payer: Self-pay | Admitting: Family

## 2019-10-23 ENCOUNTER — Ambulatory Visit (INDEPENDENT_AMBULATORY_CARE_PROVIDER_SITE_OTHER): Payer: Medicare Other | Admitting: Family

## 2019-10-23 ENCOUNTER — Other Ambulatory Visit: Payer: Self-pay

## 2019-10-23 VITALS — BP 148/78 | HR 62 | Temp 97.8°F | Ht 70.0 in | Wt 203.0 lb

## 2019-10-23 DIAGNOSIS — Z0000 Encounter for general adult medical examination without abnormal findings: Secondary | ICD-10-CM

## 2019-10-23 DIAGNOSIS — C61 Malignant neoplasm of prostate: Secondary | ICD-10-CM | POA: Diagnosis not present

## 2019-10-23 DIAGNOSIS — E663 Overweight: Secondary | ICD-10-CM

## 2019-10-23 DIAGNOSIS — Z0001 Encounter for general adult medical examination with abnormal findings: Secondary | ICD-10-CM

## 2019-10-23 DIAGNOSIS — E785 Hyperlipidemia, unspecified: Secondary | ICD-10-CM

## 2019-10-23 NOTE — Patient Instructions (Signed)

## 2019-10-23 NOTE — Progress Notes (Signed)
Subjective:    Patient ID: Howard Ramos, male    DOB: [birth year], 79 y.o.   MRN: [id number]  Chief Complaint  Patient presents with  . Medical Management of Chronic Issues    states BP runs great at home     HPI Pt presents to the office today for CPE. He is currently not taking any medications. Pt denies any headache, palpitations, SOB, or edema at this time.  He has hyperlipidemia, but states he can not take statins. He reports aching pain.   He has hx of Prostate Cancer. He is followed by Urologists every 6 months. He is followed by Pulmonologist's at the [facility] and gets a low dose CT scan annually. Stable. He reports he has a hx of smoking a pack a day for over 40 years. He quit 10-12 years ago.   Review of Systems  All other systems reviewed and are negative.  Family History  Problem Relation Age of Onset  . Hypertension Mother   . Cancer Father        Pancreatic   . Cancer Sister        Lymphoma   Social History   Socioeconomic History  . Marital status: Married    Spouse name: [name]  . Number of children: 3  . Years of education: 56  . Highest education level: Some college, no degree  Occupational History  . Occupation: retired    Comment: part-time as Dealer, painting  Tobacco Use  . Smoking status: Former Smoker    Packs/day: 1.00    Years: 40.00    Pack years: 40.00    Types: Cigarettes    Quit date: 10/24/2007    Years since quitting: 12.0  . Smokeless tobacco: Never Used  Vaping Use  . Vaping Use: Never used  Substance and Sexual Activity  . Alcohol use: No  . Drug use: No  . Sexual activity: Not Currently    Birth control/protection: None  Other Topics Concern  . Not on file  Social History Narrative  . Not on file   Social Determinants of Health   Financial Resource Strain:   . Difficulty of Paying Living Expenses:   Food Insecurity:   . Worried About Charity fundraiser in the Last Year:   . Arboriculturist in the Last Year:     Transportation Needs:   . Film/video editor (Medical):   Marland Kitchen Lack of Transportation (Non-Medical):   Physical Activity:   . Days of Exercise per Week:   . Minutes of Exercise per Session:   Stress:   . Feeling of Stress :   Social Connections:   . Frequency of Communication with Friends and Family:   . Frequency of Social Gatherings with Friends and Family:   . Attends Religious Services:   . Active Member of Clubs or Organizations:   . Attends Archivist Meetings:   Marland Kitchen Marital Status:        Objective:   Physical Exam Vitals reviewed.  Constitutional:      General: He is not in acute distress.    Appearance: He is well-developed.  HENT:     Head: Normocephalic.     Right Ear: Tympanic membrane normal.     Left Ear: Tympanic membrane normal.  Eyes:     General:        Right eye: No discharge.        Left eye: No discharge.     Pupils: Pupils  are equal, round, and reactive to light.  Neck:     Thyroid: No thyromegaly.  Cardiovascular:     Rate and Rhythm: Normal rate and regular rhythm.     Heart sounds: Normal heart sounds. No murmur heard.   Pulmonary:     Effort: Pulmonary effort is normal. No respiratory distress.     Breath sounds: Normal breath sounds. No wheezing.  Abdominal:     General: Bowel sounds are normal. There is no distension.     Palpations: Abdomen is soft.     Tenderness: There is no abdominal tenderness.  Musculoskeletal:        General: No tenderness. Normal range of motion.     Cervical back: Normal range of motion and neck supple.  Skin:    General: Skin is warm and dry.     Findings: No erythema or rash.  Neurological:     Mental Status: He is alert and oriented to person, place, and time.     Cranial Nerves: No cranial nerve deficit.     Deep Tendon Reflexes: Reflexes are normal and symmetric.  Psychiatric:        Behavior: Behavior normal.        Thought Content: Thought content normal.        Judgment: Judgment  normal.          BP (!) 148/78   Pulse 62   Temp 97.8 F (36.6 C) (Temporal)   Ht _0  (1.778 m)   Wt 203 lb (92.1 kg)   SpO2 98%   BMI 29.13 kg/m   Assessment & Plan:  Damyan Corne comes in today with chief complaint of Medical Management of Chronic Issues (states BP runs great at home ) and Annual Exam   Diagnosis and orders addressed:  1. Annual physical exam - CMP14+EGFR; Future - CBC with Differential/Platelet; Future - Lipid panel; Future - TSH; Future  2. Hyperlipidemia, unspecified hyperlipidemia type - CMP14+EGFR; Future - CBC with Differential/Platelet; Future  3. Overweight (BMI 25.0-29.9) - CMP14+EGFR; Future - CBC with Differential/Platelet; Future  4. Prostate cancer ([place]) - CMP14+EGFR; Future - CBC with Differential/Platelet; Future   Labs pending Health Maintenance reviewed Diet and exercise encouraged  Follow up plan: 1 year    Evelina Dun, FNP

## 2019-10-26 ENCOUNTER — Other Ambulatory Visit: Payer: Medicare Other

## 2019-10-26 ENCOUNTER — Other Ambulatory Visit: Payer: Self-pay

## 2019-10-26 DIAGNOSIS — E785 Hyperlipidemia, unspecified: Secondary | ICD-10-CM | POA: Diagnosis not present

## 2019-10-26 DIAGNOSIS — Z0000 Encounter for general adult medical examination without abnormal findings: Secondary | ICD-10-CM | POA: Diagnosis not present

## 2019-10-26 DIAGNOSIS — E663 Overweight: Secondary | ICD-10-CM

## 2019-10-26 DIAGNOSIS — C61 Malignant neoplasm of prostate: Secondary | ICD-10-CM

## 2019-10-27 ENCOUNTER — Telehealth: Payer: Self-pay | Admitting: *Deleted

## 2019-10-27 ENCOUNTER — Other Ambulatory Visit: Payer: Self-pay | Admitting: Family

## 2019-10-27 LAB — CBC WITH DIFFERENTIAL/PLATELET
Basophils Absolute: 0.1 10*3/uL (ref 0.0–0.2)
Basos: 1 %
EOS (ABSOLUTE): 0.2 10*3/uL (ref 0.0–0.4)
Eos: 3 %
Hematocrit: 46.6 % (ref 37.5–51.0)
Hemoglobin: 15.4 g/dL (ref 13.0–17.7)
Immature Grans (Abs): 0 10*3/uL (ref 0.0–0.1)
Immature Granulocytes: 0 %
Lymphocytes Absolute: 2.5 10*3/uL (ref 0.7–3.1)
Lymphs: 33 %
MCH: 31.1 pg (ref 26.6–33.0)
MCHC: 33 g/dL (ref 31.5–35.7)
MCV: 94 fL (ref 79–97)
Monocytes Absolute: 0.6 10*3/uL (ref 0.1–0.9)
Monocytes: 8 %
Neutrophils Absolute: 4.1 10*3/uL (ref 1.4–7.0)
Neutrophils: 55 %
Platelets: 188 10*3/uL (ref 150–450)
RBC: 4.95 x10E6/uL (ref 4.14–5.80)
RDW: 12.3 % (ref 11.6–15.4)
WBC: 7.5 10*3/uL (ref 3.4–10.8)

## 2019-10-27 LAB — CMP14+EGFR
ALT: 8 IU/L (ref 0–44)
AST: 12 IU/L (ref 0–40)
Albumin/Globulin Ratio: 2 (ref 1.2–2.2)
Albumin: 4 g/dL (ref 3.7–4.7)
Alkaline Phosphatase: 76 IU/L (ref 48–121)
BUN/Creatinine Ratio: 15 (ref 10–24)
BUN: 17 mg/dL (ref 8–27)
Bilirubin Total: 1 mg/dL (ref 0.0–1.2)
CO2: 26 mmol/L (ref 20–29)
Calcium: 9.1 mg/dL (ref 8.6–10.2)
Chloride: 101 mmol/L (ref 96–106)
Creatinine, Ser: 1.17 mg/dL (ref 0.76–1.27)
GFR calc Af Amer: 69 mL/min/{1.73_m2} (ref >59)
GFR calc non Af Amer: 59 mL/min/{1.73_m2} — ABNORMAL LOW (ref >59)
Globulin, Total: 2 g/dL (ref 1.5–4.5)
Glucose: 113 mg/dL — ABNORMAL HIGH (ref 65–99)
Potassium: 4.7 mmol/L (ref 3.5–5.2)
Sodium: 140 mmol/L (ref 134–144)
Total Protein: 6 g/dL (ref 6.0–8.5)

## 2019-10-27 LAB — LIPID PANEL
Chol/HDL Ratio: 6.3 ratio — ABNORMAL HIGH (ref 0.0–5.0)
Cholesterol, Total: 246 mg/dL — ABNORMAL HIGH (ref 100–199)
HDL: 39 mg/dL — ABNORMAL LOW (ref >39)
LDL Chol Calc (NIH): 179 mg/dL — ABNORMAL HIGH (ref 0–99)
Triglycerides: 151 mg/dL — ABNORMAL HIGH (ref 0–149)
VLDL Cholesterol Cal: 28 mg/dL (ref 5–40)

## 2019-10-27 LAB — TSH: TSH: 1.6 u[IU]/mL (ref 0.450–4.500)

## 2019-10-27 MED ORDER — PRAVASTATIN SODIUM 20 MG PO TABS: 20.0000 mg | ORAL_TABLET | Freq: Every day | ORAL | 3 refills | Status: DC

## 2019-10-27 NOTE — Telephone Encounter (Signed)
Pravastatin Prescription sent to pharmacy

## 2019-10-27 NOTE — Addendum Note (Signed)
Addended by: Evelina Dun A on: 10/27/2019 05:03 PM   Modules accepted: Orders

## 2019-10-27 NOTE — Telephone Encounter (Signed)
He doesn't have any cholesterol medication.  He does not want any lipitor but will try something else for several days a week.  Please send to [facility] in [place]. Thanks

## 2019-10-28 NOTE — Telephone Encounter (Signed)
Patient aware.

## 2019-11-26 ENCOUNTER — Other Ambulatory Visit: Payer: Self-pay

## 2019-11-26 DIAGNOSIS — C61 Malignant neoplasm of prostate: Secondary | ICD-10-CM

## 2019-12-01 NOTE — Addendum Note (Signed)
Addended by: Tommy Rainwater on: 12/01/2019 01:14 PM   Modules accepted: Orders

## 2019-12-01 NOTE — Addendum Note (Signed)
Addended by: Tommy Rainwater on: 12/01/2019 01:40 PM   Modules accepted: Orders

## 2019-12-07 ENCOUNTER — Other Ambulatory Visit: Payer: Self-pay

## 2019-12-07 DIAGNOSIS — C61 Malignant neoplasm of prostate: Secondary | ICD-10-CM

## 2019-12-07 NOTE — Progress Notes (Signed)
Opened in error

## 2019-12-08 ENCOUNTER — Ambulatory Visit: Payer: Medicare Other | Admitting: Urology

## 2019-12-08 LAB — PSA: Prostate Specific Ag, Serum: 11.8 ng/mL — ABNORMAL HIGH (ref 0.0–4.0)

## 2019-12-15 ENCOUNTER — Other Ambulatory Visit: Payer: Self-pay

## 2019-12-15 ENCOUNTER — Encounter: Payer: Self-pay | Admitting: Urology

## 2019-12-15 ENCOUNTER — Ambulatory Visit (INDEPENDENT_AMBULATORY_CARE_PROVIDER_SITE_OTHER): Payer: Medicare Other | Admitting: Urology

## 2019-12-15 DIAGNOSIS — C61 Malignant neoplasm of prostate: Secondary | ICD-10-CM | POA: Diagnosis not present

## 2019-12-15 LAB — URINALYSIS, ROUTINE W REFLEX MICROSCOPIC
Bilirubin, UA: NEGATIVE
Glucose, UA: NEGATIVE
Ketones, UA: NEGATIVE
Leukocytes,UA: NEGATIVE
Nitrite, UA: NEGATIVE
Protein,UA: NEGATIVE
Specific Gravity, UA: <1.005 — ABNORMAL LOW (ref 1.005–1.030)
Urobilinogen, Ur: 0.2 mg/dL (ref 0.2–1.0)
pH, UA: 6 (ref 5.0–7.5)

## 2019-12-15 LAB — MICROSCOPIC EXAMINATION: Renal Epithel, UA: NONE SEEN /hpf

## 2019-12-15 NOTE — Progress Notes (Signed)
H&P  Chief Complaint: Prostate Cancer  History of Present Illness: Pt reports that his urinary symptoms are stable and that he is remaining active. Pt submits no complaints or questions at this time. Pt reports ED symptomatology and previously tried sildenafil to no effect.  Most recent PSA last week was 11.8.  IPSS Questionnaire (AUA-7): Over the past month.   1)  How often have you had a sensation of not emptying your bladder completely after you finish urinating?  1 - Less than 1 time in 5  2)  How often have you had to urinate again less than two hours after you finished urinating? 2 - Less than half the time  3)  How often have you found you stopped and started again several times when you urinated?  0 - Not at all  4) How difficult have you found it to postpone urination?  1 - Less than 1 time in 5  5) How often have you had a weak urinary stream?  2 - Less than half the time  6) How often have you had to push or strain to begin urination?  0 - Not at all  7) How many times did you most typically get up to urinate from the time you went to bed until the time you got up in the morning?  2 - 2 times  Total score:  0-7 mildly symptomatic   8-19 moderately symptomatic   20-35 severely symptomatic   QOL Score: 2  (below copied from [facility] records):  Howard Ramos is a 79 year-old male established patient who is here evaluation for treatment of prostate cancer.  His prostate cancer was diagnosed 09/04/2016. He does have the pathology report from his biopsy. His PSA at his time of diagnosis was 5.7. His most recent PSA is 7.2.   This man underwent TRUS/Bx on 5.8.2018. At that time, PSA was 5.7, prostatic volume 42 mL, PSA density 0.14. 1/12 cores positive for adenocarcinoma, GS 3+3 pattern. The positive core was at the right base lateral, 10% was involved with cancer.   He decided on active surveillance.   1.14.2020: Most recent PSA 7.2 (1.6.2020). He has had no recent urologic issues. His  voiding is still excellent.   6.30.2020: Prostate MRI--volume 35.35 ml. No evidence of trans capsular/seminal vesicle/neurovascular bundle involvement, no evidence of pelvic adenopathy or bony metastatic disease. 2 ROIs--  #1--PIRADS 4 lesion Rt posterolateral peropheral zone 18x11x14 ml.  #2--PIRADS 3 lesion Lt apical posterolateral peripheral zone 8x5x5 ml.   8.17.2020: Here for fusion TRUS/Bx. Prostate volume 30 ml. 1/12 systematic cores + for PCA--GS 3+3 in 20% of core from Rt apex medial. 6 cores from 2 ROI's all benign.  2.9.2021: Most recent PSA was 7.7 on 2.1.2021. Here today for follow-up reporting improved urinary sx's since his last bx . The change he most happy with is his now  nocturia.   Only 1/12 cores POS for cancer (3+4 20%)   05/05/18 04/01/17 07/04/16 02/20/16 01/17/16 12/22/15 02/02/15 09/22/13  PSA  Total PSA 7.2 ng/dl 6.5 ng/dl 5.7 ng/dl 5.3  5.7  8.0 ng/dl 6.48 ng/dl 3.51 ng/dl  % Free PSA      12 %       Past Medical History:  Diagnosis Date  . Cancer [facility])    Prostate  . Diabetes mellitus without complication (HCC)    elevated hgb A1C- diet controlled  . Hyperlipidemia     Past Surgical History:  Procedure Laterality Date  . CATARACT  EXTRACTION W/PHACO Left 10/29/2013   Procedure: CATARACT EXTRACTION PHACO AND INTRAOCULAR LENS PLACEMENT (IOC);  Surgeon: Tonny Branch, MD;  Location: [facility];  Service: Ophthalmology;  Laterality: Left;  CDE:  15.52  . CATARACT EXTRACTION W/PHACO Right 12/07/2013   Procedure: CATARACT EXTRACTION PHACO AND INTRAOCULAR LENS PLACEMENT (IOC);  Surgeon: Tonny Branch, MD;  Location: [facility];  Service: Ophthalmology;  Laterality: Right;  CDE 16.99  . ROTATOR CUFF REPAIR Right     Home Medications:  Allergies as of 12/15/2019      Reactions   Statins    Body aches      Medication List       Accurate as of December 15, 2019 12:34 PM. If you have any questions, ask your nurse or doctor.        pravastatin 20 MG  tablet Commonly known as: PRAVACHOL Take 1 tablet (20 mg total) by mouth daily.       Allergies:  Allergies  Allergen Reactions  . Statins     Body aches    Family History  Problem Relation Age of Onset  . Hypertension Mother   . Cancer Father        Pancreatic   . Cancer Sister        Lymphoma    Social History:  reports that he quit smoking about 12 years ago. His smoking use included cigarettes. He has a 40.00 pack-year smoking history. He has never used smokeless tobacco. He reports that he does not drink alcohol and does not use drugs.  ROS: A complete review of systems was performed.  All systems are negative except for pertinent findings as noted.  Physical Exam:  Vital signs in last 24 hours: There were no vitals taken for this visit. Constitutional:  Alert and oriented, No acute distress Respiratory: Normal respiratory effort Genitourinary: Prostate: 40 grams, symmetrical. Lymphatic: No lymphadenopathy Neurologic: Grossly intact, no focal deficits Psychiatric: Normal mood and affect  I have reviewed prior pt notes  I have reviewed notes from referring/previous physicians  I have reviewed urinalysis results  I have independently reviewed prior imaging  I have reviewed prior PSA results   Impression/Assessment:  Adenocarcinoma the prostate.  Initially diagnosed in 2018.  Fusion biopsy from mid 2020 revealed only 1/16 cores positive for adenocarcinoma, the same GS 3+3 pattern noted on the initial biopsy.  PSA is up a bit but he has a benign exam.  Plan:  1. F/U in 3 months for PSA - Lab visit only  2. F/U in 6 months with OV and PSA  CC: Evelina Dun FNP

## 2019-12-15 NOTE — Progress Notes (Signed)
Urological Symptom Review  Patient is experiencing the following symptoms: Frequent urination Erection problems (male only)   Review of Systems  Gastrointestinal (upper)  : Negative for upper GI symptoms  Gastrointestinal (lower) : Negative for lower GI symptoms  Constitutional : Negative for symptoms  Skin: Negative for skin symptoms  Eyes: Negative for eye symptoms  Ear/Nose/Throat : Negative for Ear/Nose/Throat symptoms  Hematologic/Lymphatic: Negative for Hematologic/Lymphatic symptoms  Cardiovascular : Negative for cardiovascular symptoms  Respiratory : Negative for respiratory symptoms  Endocrine: Negative for endocrine symptoms  Musculoskeletal: Negative for musculoskeletal symptoms  Neurological: Negative for neurological symptoms  Psychologic: Negative for psychiatric symptoms

## 2020-03-14 ENCOUNTER — Other Ambulatory Visit: Payer: Medicare Other

## 2020-03-15 ENCOUNTER — Other Ambulatory Visit: Payer: Self-pay

## 2020-03-15 ENCOUNTER — Other Ambulatory Visit: Payer: Medicare Other

## 2020-03-15 DIAGNOSIS — C61 Malignant neoplasm of prostate: Secondary | ICD-10-CM

## 2020-03-16 LAB — PSA: Prostate Specific Ag, Serum: 11.1 ng/mL — ABNORMAL HIGH (ref 0.0–4.0)

## 2020-03-17 NOTE — Progress Notes (Signed)
Sent via mychart

## 2020-03-18 ENCOUNTER — Other Ambulatory Visit: Payer: Self-pay

## 2020-03-18 ENCOUNTER — Ambulatory Visit (INDEPENDENT_AMBULATORY_CARE_PROVIDER_SITE_OTHER): Payer: Medicare Other

## 2020-03-18 DIAGNOSIS — Z23 Encounter for immunization: Secondary | ICD-10-CM

## 2020-04-21 ENCOUNTER — Encounter: Payer: Self-pay | Admitting: Family

## 2020-04-21 ENCOUNTER — Ambulatory Visit (INDEPENDENT_AMBULATORY_CARE_PROVIDER_SITE_OTHER): Payer: Medicare Other | Admitting: Family

## 2020-04-21 DIAGNOSIS — H60501 Unspecified acute noninfective otitis externa, right ear: Secondary | ICD-10-CM

## 2020-04-21 MED ORDER — CIPROFLOXACIN-DEXAMETHASONE 0.3-0.1 % OT SUSP: 4.0000 [drp] | Freq: Two times a day (BID) | OTIC | 0 refills | Status: DC

## 2020-04-21 NOTE — Progress Notes (Signed)
   Virtual Visit via telephone Note Due to COVID-19 pandemic this visit was conducted virtually. This visit type was conducted due to national recommendations for restrictions regarding the COVID-19 Pandemic (e.g. social distancing, sheltering in place) in an effort to limit this patient's exposure and mitigate transmission in our community. All issues noted in this document were discussed and addressed.  A physical exam was not performed with this format.  I connected with Howard Ramos on 04/21/20 at 2:20 pm  by telephone and verified that I am speaking with the correct person using two identifiers. Howard Ramos is currently located at home and no one is currently with him  during visit. The provider, Evelina Dun, FNP is located in their office at time of visit.  I discussed the limitations, risks, security and privacy concerns of performing an evaluation and management service by telephone and the availability of in person appointments. I also discussed with the patient that there may be a patient responsible charge related to this service. The patient expressed understanding and agreed to proceed.   History and Present Illness:   PT calls the office today with right ear pain over the last 3 days. He states he started taking some of his wife's cipro/dex drops yesterday and reports his pain has greatly improved .  Otalgia  There is pain in the right ear. This is a new problem. The current episode started in the past 7 days. Episode frequency: intermittent  The problem has been waxing and waning. There has been no fever. The pain is at a severity of 9/10. The pain is moderate. Pertinent negatives include no coughing, diarrhea, ear discharge, hearing loss, rhinorrhea or sore throat. He has tried acetaminophen for the symptoms. The treatment provided mild relief.     Review of Systems  HENT: Positive for ear pain. Negative for ear discharge, hearing loss, rhinorrhea and sore throat.   Respiratory:  Negative for cough.   Gastrointestinal: Negative for diarrhea.     Observations/Objective: No SOB or distress noted   Assessment and Plan: 1. Acute otitis externa of right ear, unspecified type Keep clean and dry Avoid getting water into ear Tylenol as needed Call if symptoms worsen or do not improve  - ciprofloxacin-dexamethasone (CIPRODEX) OTIC suspension; Place 4 drops into the right ear 2 (two) times daily.  Dispense: 7.5 mL; Refill: 0       I discussed the assessment and treatment plan with the patient. The patient was provided an opportunity to ask questions and all were answered. The patient agreed with the plan and demonstrated an understanding of the instructions.   The patient was advised to call back or seek an in-person evaluation if the symptoms worsen or if the condition fails to improve as anticipated.  The above assessment and management plan was discussed with the patient. The patient verbalized understanding of and has agreed to the management plan. Patient is aware to call the clinic if symptoms persist or worsen. Patient is aware when to return to the clinic for a follow-up visit. Patient educated on when it is appropriate to go to the emergency department.   Time call ended:  2:31 pm   I provided 11 minutes of non-face-to-face time during this encounter.    Evelina Dun, FNP

## 2020-06-13 ENCOUNTER — Other Ambulatory Visit: Payer: Medicare Other

## 2020-06-13 ENCOUNTER — Other Ambulatory Visit: Payer: Self-pay | Admitting: Urology

## 2020-06-14 LAB — PSA: Prostate Specific Ag, Serum: 14.3 ng/mL — ABNORMAL HIGH (ref 0.0–4.0)

## 2020-06-21 ENCOUNTER — Ambulatory Visit: Payer: Medicare Other | Admitting: Urology

## 2020-07-04 NOTE — Progress Notes (Signed)
History of Present Illness: Here for follow-up of prostate cancer.    TRUS/Bx on 5.8.2018. At that time, PSA was 5.7, prostatic volume 42 mL, PSA density 0.14. 1/12 cores positive for adenocarcinoma, GS 3+3 pattern. The positive core was at the right base lateral, 10% was involved with cancer.   He decided on active surveillance.   1.14.2020: Most recent PSA 7.2 (1.6.2020). He has had no recent urologic issues. His voiding is still excellent.   6.30.2020: Prostate MRI--volume 35.35 ml. No evidence of trans capsular/seminal vesicle/neurovascular bundle involvement, no evidence of pelvic adenopathy or bony metastatic disease. 2 ROIs--  #1--PIRADS 4 lesion Rt posterolateral peropheral zone 18x11x14 ml.  #2--PIRADS 3 lesion Lt apical posterolateral peripheral zone 8x5x5 ml.   8.17.2020: Fusion TRUS/Bx. Prostate volume 30 ml. 1/12 systematic cores + for PCA--GS 3+4 in 20% of core from Rt apex medial. 6 cores from 2 ROI's all benign.  3.8.2022: Most recent PSA 14.3.  He is having no significant lower urinary tract symptomatic changes. IPSS 7 Quality-of-life score 2   05/05/18 04/01/17 07/04/16 02/20/16 01/17/16 12/22/15 02/02/15 09/22/13  PSA  Total PSA 7.2 ng/dl 6.5 ng/dl 5.7 ng/dl 5.3  5.7  8.0 ng/dl 6.48 ng/dl 3.51 ng/dl  % Free PSA             Past Medical History:  Diagnosis Date  . Cancer [facility])    Prostate  . Diabetes mellitus without complication (HCC)    elevated hgb A1C- diet controlled  . Hyperlipidemia   . Prostate cancer [facility])     Past Surgical History:  Procedure Laterality Date  . CATARACT EXTRACTION W/PHACO Left 10/29/2013   Procedure: CATARACT EXTRACTION PHACO AND INTRAOCULAR LENS PLACEMENT (IOC);  Surgeon: Tonny Branch, MD;  Location: [facility];  Service: Ophthalmology;  Laterality: Left;  CDE:  15.52  . CATARACT EXTRACTION W/PHACO Right 12/07/2013   Procedure: CATARACT EXTRACTION PHACO AND INTRAOCULAR LENS PLACEMENT (IOC);  Surgeon: Tonny Branch, MD;  Location: [facility];  Service: Ophthalmology;  Laterality: Right;  CDE 16.99  . ROTATOR CUFF REPAIR Right     Home Medications:  Allergies as of 07/05/2020      Reactions   Statins    Body aches      Medication List       Accurate as of July 04, 2020  8:57 PM. If you have any questions, ask your nurse or doctor.        ciprofloxacin-dexamethasone OTIC suspension Commonly known as: CIPRODEX Place 4 drops into the right ear 2 (two) times daily.   pravastatin 20 MG tablet Commonly known as: PRAVACHOL Take 1 tablet (20 mg total) by mouth daily.       Allergies:  Allergies  Allergen Reactions  . Statins     Body aches    Family History  Problem Relation Age of Onset  . Hypertension Mother   . Cancer Father        Pancreatic   . Cancer Sister        Lymphoma    Social History:  reports that he quit smoking about 12 years ago. His smoking use included cigarettes. He has a 40.00 pack-year smoking history. He has never used smokeless tobacco. He reports that he does not drink alcohol and does not use drugs.  ROS: A complete review of systems was performed.  All systems are negative except for pertinent findings as noted.  Physical Exam:  Vital signs in last 24 hours: There were no vitals taken for  this visit. Constitutional:  Alert and oriented, No acute distress Cardiovascular: Regular rate  Respiratory: Normal respiratory effort GI: Abdomen is soft, nontender, nondistended, no abdominal masses. No CVAT.  Genitourinary: Normal male phallus, testes are descended bilaterally and non-tender and without masses, scrotum is normal in appearance without lesions or masses, perineum is normal on inspection.  Prostate 40 mL in size, symmetric, nonnodular, nontender Neurologic: Grossly intact, no focal deficits Psychiatric: Normal mood and affect  I have reviewed prior pt notes  I have reviewed notes from referring/previous physicians  I have reviewed urinalysis results  I have  independently reviewed prior imaging  I have reviewed prior PSA/pathology results    Impression/Assessment:  Low volume favorable intermediate risk prostate cancer.  PSA has had a significant upward trend recently.  Benign exam.  Plan:  1.  I will recheck his PSA with an office visit in 3 months  2.  If significant persistent upward trend, we will look at repeating MRI and fusion biopsy

## 2020-07-05 ENCOUNTER — Ambulatory Visit (INDEPENDENT_AMBULATORY_CARE_PROVIDER_SITE_OTHER): Payer: Medicare Other | Admitting: Urology

## 2020-07-05 ENCOUNTER — Other Ambulatory Visit: Payer: Self-pay

## 2020-07-05 ENCOUNTER — Encounter: Payer: Self-pay | Admitting: Urology

## 2020-07-05 VITALS — BP 159/77 | HR 82 | Temp 97.9°F | Ht 70.0 in | Wt 200.0 lb

## 2020-07-05 DIAGNOSIS — C61 Malignant neoplasm of prostate: Secondary | ICD-10-CM | POA: Diagnosis not present

## 2020-07-05 LAB — URINALYSIS, ROUTINE W REFLEX MICROSCOPIC
Bilirubin, UA: NEGATIVE
Glucose, UA: NEGATIVE
Ketones, UA: NEGATIVE
Leukocytes,UA: NEGATIVE
Nitrite, UA: NEGATIVE
Protein,UA: NEGATIVE
RBC, UA: NEGATIVE
Specific Gravity, UA: <1.005 — ABNORMAL LOW (ref 1.005–1.030)
Urobilinogen, Ur: 0.2 mg/dL (ref 0.2–1.0)
pH, UA: 5.5 (ref 5.0–7.5)

## 2020-07-05 NOTE — Addendum Note (Signed)
Addended by: Valentina Lucks on: 07/05/2020 04:25 PM   Modules accepted: Orders

## 2020-07-05 NOTE — Progress Notes (Signed)

## 2020-08-05 ENCOUNTER — Other Ambulatory Visit: Payer: Self-pay

## 2020-08-05 ENCOUNTER — Encounter: Payer: Self-pay | Admitting: Family Medicine

## 2020-08-05 ENCOUNTER — Ambulatory Visit (INDEPENDENT_AMBULATORY_CARE_PROVIDER_SITE_OTHER): Payer: Medicare Other | Admitting: Family Medicine

## 2020-08-05 VITALS — BP 139/81 | HR 77 | Temp 97.2°F | Ht 70.0 in | Wt 202.2 lb

## 2020-08-05 DIAGNOSIS — A084 Viral intestinal infection, unspecified: Secondary | ICD-10-CM

## 2020-08-05 MED ORDER — LOPERAMIDE HCL 2 MG PO TABS: ORAL_TABLET | ORAL | 0 refills | Status: DC

## 2020-08-05 NOTE — Patient Instructions (Signed)
Viral Gastroenteritis, Adult  Viral gastroenteritis is also known as the stomach flu. This condition may affect your stomach, small intestine, and large intestine. It can cause sudden watery diarrhea, fever, and vomiting. This condition is caused by many different viruses. These viruses can be passed from person to person very easily (are contagious). Diarrhea and vomiting can make you feel weak and cause you to become dehydrated. You may not be able to keep fluids down. Dehydration can make you tired and thirsty, cause you to have a dry mouth, and decrease how often you urinate. It is important to replace the fluids that you lose from diarrhea and vomiting. What are the causes? Gastroenteritis is caused by many viruses, including rotavirus and norovirus. Norovirus is the most common cause in adults. You can get sick after being exposed to the viruses from other people. You can also get sick by:  Eating food, drinking water, or touching a surface contaminated with one of these viruses.  Sharing utensils or other personal items with an infected person. What increases the risk? You are more likely to develop this condition if you:  Have a weak body defense system (immune system).  Live with one or more children who are younger than 2 years old.  Live in a nursing home.  Travel on cruise ships. What are the signs or symptoms? Symptoms of this condition start suddenly 1-3 days after exposure to a virus. Symptoms may last for a few days or for as long as a week. Common symptoms include watery diarrhea and vomiting. Other symptoms include:  Fever.  Headache.  Fatigue.  Pain in the abdomen.  Chills.  Weakness.  Nausea.  Muscle aches.  Loss of appetite. How is this diagnosed? This condition is diagnosed with a medical history and physical exam. You may also have a stool test to check for viruses or other infections. How is this treated? This condition typically goes away on its  own. The focus of treatment is to prevent dehydration and restore lost fluids (rehydration). This condition may be treated with:  An oral rehydration solution (ORS) to replace important salts and minerals (electrolytes) in your body. Take this if told by your health care provider. This is a drink that is sold at pharmacies and retail stores.  Medicines to help with your symptoms.  Probiotic supplements to reduce symptoms of diarrhea.  Fluids given through an IV, if dehydration is severe. Older adults and people with other diseases or a weak immune system are at higher risk for dehydration. Follow these instructions at home: Eating and drinking  Take an ORS as told by your health care provider.  Drink clear fluids in small amounts as you are able. Clear fluids include: ? Water. ? Ice chips. ? Diluted fruit juice. ? Low-calorie sports drinks.  Drink enough fluid to keep your urine pale yellow.  Eat small amounts of healthy foods every 3-4 hours as you are able. This may include whole grains, fruits, vegetables, lean meats, and yogurt.  Avoid fluids that contain a lot of sugar or caffeine, such as energy drinks, sports drinks, and soda.  Avoid spicy or fatty foods.  Avoid alcohol.   General instructions  Wash your hands often, especially after having diarrhea or vomiting. If soap and water are not available, use hand sanitizer.  Make sure that all people in your household wash their hands well and often.  Take over-the-counter and prescription medicines only as told by your health care provider.  Rest at   home while you recover.  Watch your condition for any changes.  Take a warm bath to relieve any burning or pain from frequent diarrhea episodes.  Keep all follow-up visits as told by your health care provider. This is important.   Contact a health care provider if you:  Cannot keep fluids down.  Have symptoms that get worse.  Have new symptoms.  Feel light-headed or  dizzy.  Have muscle cramps. Get help right away if you:  Have chest pain.  Feel extremely weak or you faint.  See blood in your vomit.  Have vomit that looks like coffee grounds.  Have bloody or black stools or stools that look like tar.  Have a severe headache, a stiff neck, or both.  Have a rash.  Have severe pain, cramping, or bloating in your abdomen.  Have trouble breathing or you are breathing very quickly.  Have a fast heartbeat.  Have skin that feels cold and clammy.  Feel confused.  Have pain when you urinate.  Have signs of dehydration, such as: ? Dark urine, very little urine, or no urine. ? Cracked lips. ? Dry mouth. ? Sunken eyes. ? Sleepiness. ? Weakness. Summary  Viral gastroenteritis is also known as the stomach flu. It can cause sudden watery diarrhea, fever, and vomiting.  This condition can be passed from person to person very easily (is contagious).  Take an ORS if told by your health care provider. This is a drink that is sold at pharmacies and retail stores.  Wash your hands often, especially after having diarrhea or vomiting. If soap and water are not available, use hand sanitizer. This information is not intended to replace advice given to you by your health care provider. Make sure you discuss any questions you have with your health care provider. Document Revised: 10/03/2018 Document Reviewed: 02/19/2018 Elsevier Patient Education  2021 Elsevier Inc.  

## 2020-08-05 NOTE — Progress Notes (Signed)
Acute Office Visit  Subjective:    Patient ID: Howard Ramos, male    DOB: Feb 02, 1941, 80 y.o.   MRN: [id number]  Chief Complaint  Patient presents with  . Diarrhea    HPI Patient is in today for diarrhea x 6 days. Reports 2-3x of watery diarrhea in the last 24 hours. He also reports pain on his right lower abdomen. It is an achy pain. He had one episode of vomiting 3 days ago. He also reports a decreased appetite. and fatigue. He has been eating bland foods. He has been drinking water and hydration drinks. Denies fever, blood in stool.   Overall his symptoms have gotten better. He has not taken anything for his symptoms. Negative home Covid test 2 days ago. No recent antibiotics. Denies dizziness.   Past Medical History:  Diagnosis Date  . Cancer [facility])    Prostate  . Diabetes mellitus without complication (HCC)    elevated hgb A1C- diet controlled  . Hyperlipidemia   . Prostate cancer [facility])     Past Surgical History:  Procedure Laterality Date  . CATARACT EXTRACTION W/PHACO Left 10/29/2013   Procedure: CATARACT EXTRACTION PHACO AND INTRAOCULAR LENS PLACEMENT (IOC);  Surgeon: Tonny Branch, MD;  Location: [facility];  Service: Ophthalmology;  Laterality: Left;  CDE:  15.52  . CATARACT EXTRACTION W/PHACO Right 12/07/2013   Procedure: CATARACT EXTRACTION PHACO AND INTRAOCULAR LENS PLACEMENT (IOC);  Surgeon: Tonny Branch, MD;  Location: [facility];  Service: Ophthalmology;  Laterality: Right;  CDE 16.99  . ROTATOR CUFF REPAIR Right     Family History  Problem Relation Age of Onset  . Hypertension Mother   . Cancer Father        Pancreatic   . Cancer Sister        Lymphoma    Social History   Socioeconomic History  . Marital status: Married    Spouse name: [name]  . Number of children: 3  . Years of education: 54  . Highest education level: Some college, no degree  Occupational History  . Occupation: retired    Comment: part-time as Dealer, painting  Tobacco Use  . Smoking  status: Former Smoker    Packs/day: 1.00    Years: 40.00    Pack years: 40.00    Types: Cigarettes    Quit date: 10/24/2007    Years since quitting: 12.7  . Smokeless tobacco: Never Used  Vaping Use  . Vaping Use: Never used  Substance and Sexual Activity  . Alcohol use: No  . Drug use: No  . Sexual activity: Not Currently    Birth control/protection: None  Other Topics Concern  . Not on file  Social History Narrative  . Not on file   Social Determinants of Health   Financial Resource Strain: Not on file  Food Insecurity: Not on file  Transportation Needs: Not on file  Physical Activity: Not on file  Stress: Not on file  Social Connections: Not on file  Intimate Partner Violence: Not on file    Outpatient Medications Prior to Visit  Medication Sig Dispense Refill  . pravastatin (PRAVACHOL) 20 MG tablet Take 1 tablet (20 mg total) by mouth daily. 90 tablet 3  . ciprofloxacin-dexamethasone (CIPRODEX) OTIC suspension Place 4 drops into the right ear 2 (two) times daily. (Patient not taking: Reported on 07/05/2020) 7.5 mL 0   No facility-administered medications prior to visit.    Allergies  Allergen Reactions  . Statins     Body aches  Review of Systems As per HPI.     Objective:    Physical Exam Vitals and nursing note reviewed.  Constitutional:      General: He is not in acute distress.    Appearance: Normal appearance. He is not toxic-appearing or diaphoretic.  HENT:     Head: Normocephalic and atraumatic.     Mouth/Throat:     Mouth: Mucous membranes are moist.     Pharynx: Oropharynx is clear.  Eyes:     Conjunctiva/sclera: Conjunctivae normal.     Pupils: Pupils are equal, round, and reactive to light.  Cardiovascular:     Rate and Rhythm: Normal rate and regular rhythm.     Heart sounds: Normal heart sounds. No murmur heard.   Abdominal:     General: Bowel sounds are normal. There is no distension.     Palpations: Abdomen is soft.      Tenderness: There is generalized abdominal tenderness. There is no guarding or rebound. Negative signs include Murphy's sign and McBurney's sign.  Skin:    General: Skin is warm and dry.  Neurological:     General: No focal deficit present.     Mental Status: He is alert and oriented to person, place, and time.  Psychiatric:        Mood and Affect: Mood normal.        Behavior: Behavior normal.     BP 139/81   Pulse 77   Temp (!) 97.2 F (36.2 C) (Temporal)   Ht 5\' 10"  (1.778 m)   Wt 202 lb 4 oz (91.7 kg)   BMI 29.02 kg/m  Wt Readings from Last 3 Encounters:  08/05/20 202 lb 4 oz (91.7 kg)  07/05/20 200 lb (90.7 kg)  10/23/19 203 lb (92.1 kg)    Health Maintenance Due  Topic Date Due  . Hepatitis C Screening  Never done  . COVID-19 Vaccine (3 - Moderna risk 4-dose series) 07/16/2019    There are no preventive care reminders to display for this patient.   Lab Results  Component Value Date   TSH 1.600 10/26/2019   Lab Results  Component Value Date   WBC 7.5 10/26/2019   HGB 15.4 10/26/2019   HCT 46.6 10/26/2019   MCV 94 10/26/2019   PLT 188 10/26/2019   Lab Results  Component Value Date   NA 140 10/26/2019   K 4.7 10/26/2019   CO2 26 10/26/2019   GLUCOSE 113 (H) 10/26/2019   BUN 17 10/26/2019   CREATININE 1.17 10/26/2019   BILITOT 1.0 10/26/2019   ALKPHOS 76 10/26/2019   AST 12 10/26/2019   ALT 8 10/26/2019   PROT 6.0 10/26/2019   ALBUMIN 4.0 10/26/2019   CALCIUM 9.1 10/26/2019   Lab Results  Component Value Date   CHOL 246 (H) 10/26/2019   Lab Results  Component Value Date   HDL 39 (L) 10/26/2019   Lab Results  Component Value Date   LDLCALC 179 (H) 10/26/2019   Lab Results  Component Value Date   TRIG 151 (H) 10/26/2019   Lab Results  Component Value Date   CHOLHDL 6.3 (H) 10/26/2019   No results found for: HGBA1C     Assessment & Plan:   Sahmir was seen today for diarrhea.  Diagnoses and all orders for this visit:  Viral  gastroenteritis Improving symptoms. Imodium as below. Stay well hydrated, continue rehydration fluids. Bland diet. Handout given. Return to office for new or worsening symptoms, or if symptoms persist.  -  loperamide (IMODIUM A-D) 2 MG tablet; Take 2 tablets after first loose stool, then 1 tablet after each loose stool. Do not take more than 4 tablets in 24 hours.  The patient indicates understanding of these issues and agrees with the plan.   Gwenlyn Perking, FNP

## 2020-09-27 ENCOUNTER — Other Ambulatory Visit: Payer: Self-pay

## 2020-09-27 ENCOUNTER — Other Ambulatory Visit: Payer: Medicare Other

## 2020-09-27 DIAGNOSIS — C61 Malignant neoplasm of prostate: Secondary | ICD-10-CM

## 2020-09-28 LAB — PSA: Prostate Specific Ag, Serum: 11.5 ng/mL — ABNORMAL HIGH (ref 0.0–4.0)

## 2020-10-03 NOTE — Progress Notes (Signed)
History of Present Illness: Here for followup of PCa.  TRUS/Bx on 5.8.2018. At that time, PSA was 5.7, prostatic volume 42 mL, PSA density 0.14. 1/12 cores positive for adenocarcinoma, GS 3+3 pattern. The positive core was at the right base lateral, 10% was involved with cancer.   He decided on active surveillance.   1.14.2020: Most recent PSA 7.2 (1.6.2020). He has had no recent urologic issues. His voiding is still excellent.   6.30.2020: Prostate MRI--volume 35.35 ml. No evidence of trans capsular/seminal vesicle/neurovascular bundle involvement, no evidence of pelvic adenopathy or bony metastatic disease. 2 ROIs--  #1--PIRADS 4 lesion Rt posterolateral peropheral zone 18x11x14 ml.  #2--PIRADS 3 lesion Lt apical posterolateral peripheral zone 8x5x5 ml.   8.17.2020: Fusion TRUS/Bx. Prostate volume 30 ml. 1/12 systematic cores + for PCA--GS 3+4 in 20% of core from Rt apex medial. 6 cores from 2 ROI's all benign.  3.8.2022: PSA 14.3.  6.8.2022 : PSA 11.5. Denies significant LUTS. Not worried about PCa.  Past Medical History:  Diagnosis Date  . Cancer [facility])    Prostate  . Diabetes mellitus without complication (HCC)    elevated hgb A1C- diet controlled  . Hyperlipidemia   . Prostate cancer [facility])     Past Surgical History:  Procedure Laterality Date  . CATARACT EXTRACTION W/PHACO Left 10/29/2013   Procedure: CATARACT EXTRACTION PHACO AND INTRAOCULAR LENS PLACEMENT (IOC);  Surgeon: Tonny Branch, MD;  Location: [facility];  Service: Ophthalmology;  Laterality: Left;  CDE:  15.52  . CATARACT EXTRACTION W/PHACO Right 12/07/2013   Procedure: CATARACT EXTRACTION PHACO AND INTRAOCULAR LENS PLACEMENT (IOC);  Surgeon: Tonny Branch, MD;  Location: [facility];  Service: Ophthalmology;  Laterality: Right;  CDE 16.99  . ROTATOR CUFF REPAIR Right     Home Medications:  Allergies as of 10/04/2020      Reactions   Statins    Body aches      Medication List       Accurate as of October 03, 2020  8:48 PM. If  you have any questions, ask your nurse or doctor.        loperamide 2 MG tablet Commonly known as: IMODIUM A-D Take 2 tablets after first loose stool, then 1 tablet after each loose stool. Do not take more than 4 tablets in 24 hours.   pravastatin 20 MG tablet Commonly known as: PRAVACHOL Take 1 tablet (20 mg total) by mouth daily.       Allergies:  Allergies  Allergen Reactions  . Statins     Body aches    Family History  Problem Relation Age of Onset  . Hypertension Mother   . Cancer Father        Pancreatic   . Cancer Sister        Lymphoma    Social History:  reports that he quit smoking about 12 years ago. His smoking use included cigarettes. He has a 40.00 pack-year smoking history. He has never used smokeless tobacco. He reports that he does not drink alcohol and does not use drugs.  ROS: A complete review of systems was performed.  All systems are negative except for pertinent findings as noted.  Physical Exam:  Vital signs in last 24 hours: There were no vitals taken for this visit. Constitutional:  Alert and oriented, No acute distress Cardiovascular: Regular rate  Respiratory: Normal respiratory effort GI: Abdomen is soft, nontender, nondistended, no abdominal masses. No CVAT.  Genitourinary: Normal male phallus, testes are descended bilaterally and non-tender and  without masses, scrotum is normal in appearance without lesions or masses, perineum is normal on inspection.  Prostate 50 g, symmetrical, nonnodular, nontender. Lymphatic: No lymphadenopathy Neurologic: Grossly intact, no focal deficits Psychiatric: Normal mood and affect  I have reviewed prior pt notes   I have reviewed prior PSA results  Impression/Assessment:  Prostate cancer, favorable intermediate risk, very low volume, on active surveillance.  PSA fairly stable  Plan:  I will see him back in 6 months following PSA  I would probably like to get a repeat MRI in a year with possible  biopsy after that

## 2020-10-04 ENCOUNTER — Encounter: Payer: Self-pay | Admitting: Urology

## 2020-10-04 ENCOUNTER — Ambulatory Visit: Payer: Medicare Other | Admitting: Urology

## 2020-10-04 ENCOUNTER — Other Ambulatory Visit: Payer: Self-pay

## 2020-10-04 VITALS — BP 163/75 | HR 66 | Ht 70.0 in | Wt 200.0 lb

## 2020-10-04 DIAGNOSIS — C61 Malignant neoplasm of prostate: Secondary | ICD-10-CM | POA: Diagnosis not present

## 2020-10-04 NOTE — Progress Notes (Signed)
Urological Symptom Review  Patient is experiencing the following symptoms: None   Review of Systems  Gastrointestinal (upper)  : None  Gastrointestinal (lower) : Negative for lower GI symptoms  Constitutional : Negative for symptoms  Skin: Negative for skin symptoms  Eyes: Negative for eye symptoms  Ear/Nose/Throat : Negative for Ear/Nose/Throat symptoms  Hematologic/Lymphatic: Negative for Hematologic/Lymphatic symptoms  Cardiovascular : Negative for cardiovascular symptoms  Respiratory : Negative for respiratory symptoms  Endocrine: Negative for endocrine symptoms  Musculoskeletal: Negative for musculoskeletal symptoms  Neurological: Negative for neurological symptoms  Psychologic: Negative for psychiatric symptoms

## 2020-10-14 ENCOUNTER — Ambulatory Visit (INDEPENDENT_AMBULATORY_CARE_PROVIDER_SITE_OTHER): Payer: Medicare Other | Admitting: Nurse Practitioner

## 2020-10-14 ENCOUNTER — Encounter: Payer: Self-pay | Admitting: Nurse Practitioner

## 2020-10-14 DIAGNOSIS — U071 COVID-19: Secondary | ICD-10-CM

## 2020-10-14 MED ORDER — MOLNUPIRAVIR EUA 200MG CAPSULE: 4.0000 | ORAL_CAPSULE | Freq: Two times a day (BID) | ORAL | 0 refills | Status: AC

## 2020-10-14 NOTE — Progress Notes (Signed)
   Virtual Visit  Note Due to COVID-19 pandemic this visit was conducted virtually. This visit type was conducted due to national recommendations for restrictions regarding the COVID-19 Pandemic (e.g. social distancing, sheltering in place) in an effort to limit this patient's exposure and mitigate transmission in our community. All issues noted in this document were discussed and addressed.  A physical exam was not performed with this format.  I connected with Howard Ramos on 10/14/20 at 1:27 by telephone and verified that I am speaking with the correct person using two identifiers. Howard Ramos is currently located at home and her husband is currently with her during visit. The provider, Mary-Margaret Hassell Done, FNP is located in their office at time of visit.  I discussed the limitations, risks, security and privacy concerns of performing an evaluation and management service by telephone and the availability of in person appointments. I also discussed with the patient that there may be a patient responsible charge related to this service. The patient expressed understanding and agreed to proceed.   History and Present Illness:   Chief Complaint: covid positive  HPI Patient calls in stating he has had sore throat and congestion for 3 days. Covid test yesterday was positive. Has had 2 vaccines    Review of Systems  Constitutional:  Positive for chills and fever.  HENT:  Positive for congestion and sore throat.   Respiratory:  Negative for cough.   Musculoskeletal:  Positive for myalgias.  Neurological:  Negative for headaches.    Observations/Objective: Alert and oriented- answers all questions appropriately No distress Voice hoarse No cough noted  Assessment and Plan:  Howard Ramos in today with chief complaint of Covid Positive   1. Lab test positive for detection of COVID-19 virus Force fluids  Rest Quarantine for 5 days Motrin or tylenol OTC  Meds ordered this encounter   Medications   molnupiravir EUA 200 mg CAPS    Sig: Take 4 capsules (800 mg total) by mouth 2 (two) times daily for 5 days.    Dispense:  40 capsule    Refill:  0    Order Specific Question:   Supervising Provider    Answer:   Caryl Pina A [[id number]]          Follow Up Instructions: prn    I discussed the assessment and treatment plan with the patient. The patient was provided an opportunity to ask questions and all were answered. The patient agreed with the plan and demonstrated an understanding of the instructions.   The patient was advised to call back or seek an in-person evaluation if the symptoms worsen or if the condition fails to improve as anticipated.  The above assessment and management plan was discussed with the patient. The patient verbalized understanding of and has agreed to the management plan. Patient is aware to call the clinic if symptoms persist or worsen. Patient is aware when to return to the clinic for a follow-up visit. Patient educated on when it is appropriate to go to the emergency department.   Time call ended:  1:40  I provided 12 minutes of  non face-to-face time during this encounter.    Mary-Margaret Hassell Done, FNP

## 2020-10-19 ENCOUNTER — Ambulatory Visit (INDEPENDENT_AMBULATORY_CARE_PROVIDER_SITE_OTHER): Payer: Medicare Other

## 2020-10-19 VITALS — Ht 70.0 in | Wt 200.0 lb

## 2020-10-19 DIAGNOSIS — Z0000 Encounter for general adult medical examination without abnormal findings: Secondary | ICD-10-CM | POA: Diagnosis not present

## 2020-10-19 NOTE — Patient Instructions (Signed)
Mr. Howard Ramos , Thank you for taking time to come for your Medicare Wellness Visit. I appreciate your ongoing commitment to your health goals. Please review the following plan we discussed and let me know if I can assist you in the future.   Screening recommendations/referrals: Colonoscopy: No longer required  Recommended yearly ophthalmology/optometry visit for glaucoma screening and checkup Recommended yearly dental visit for hygiene and checkup  Vaccinations: Influenza vaccine: Done 03/18/2020 - Repeat annually Pneumococcal vaccine: Done at [facility] - We need dates Tdap vaccine: Done 09/22/2013 - Repeat in 10 years Shingles vaccine: Done at [facility] - we need dates   Covid-19: Done 05/21/2019 & 06/18/2019 - due for booster  Advanced directives: Advance directive discussed with you today. Even though you declined this today, please call our office should you change your mind, and we can give you the proper paperwork for you to fill out.  Conditions/risks identified: Aim for 30 minutes of exercise or brisk walking each day, drink 6-8 glasses of water and eat lots of fruits and vegetables.  Next appointment: Follow up in one year for your annual wellness visit.   Preventive Care 28 Years and Older, Male  Preventive care refers to lifestyle choices and visits with your health care provider that can promote health and wellness. What does preventive care include? A yearly physical exam. This is also called an annual well check. Dental exams once or twice a year. Routine eye exams. Ask your health care provider how often you should have your eyes checked. Personal lifestyle choices, including: Daily care of your teeth and gums. Regular physical activity. Eating a healthy diet. Avoiding tobacco and drug use. Limiting alcohol use. Practicing safe sex. Taking low doses of aspirin every day. Taking vitamin and mineral supplements as recommended by your health care provider. What happens during an annual  well check? The services and screenings done by your health care provider during your annual well check will depend on your age, overall health, lifestyle risk factors, and family history of disease. Counseling  Your health care provider may ask you questions about your: Alcohol use. Tobacco use. Drug use. Emotional well-being. Home and relationship well-being. Sexual activity. Eating habits. History of falls. Memory and ability to understand (cognition). Work and work Statistician. Screening  You may have the following tests or measurements: Height, weight, and BMI. Blood pressure. Lipid and cholesterol levels. These may be checked every 5 years, or more frequently if you are over 30 years old. Skin check. Lung cancer screening. You may have this screening every year starting at age 9 if you have a 30-pack-year history of smoking and currently smoke or have quit within the past 15 years. Fecal occult blood test (FOBT) of the stool. You may have this test every year starting at age 85. Flexible sigmoidoscopy or colonoscopy. You may have a sigmoidoscopy every 5 years or a colonoscopy every 10 years starting at age 89. Prostate cancer screening. Recommendations will vary depending on your family history and other risks. Hepatitis C blood test. Hepatitis B blood test. Sexually transmitted disease (STD) testing. Diabetes screening. This is done by checking your blood sugar (glucose) after you have not eaten for a while (fasting). You may have this done every 1-3 years. Abdominal aortic aneurysm (AAA) screening. You may need this if you are a current or former smoker. Osteoporosis. You may be screened starting at age 68 if you are at high risk. Talk with your health care provider about your test results, treatment options,  and if necessary, the need for more tests. Vaccines  Your health care provider may recommend certain vaccines, such as: Influenza vaccine. This is recommended every  year. Tetanus, diphtheria, and acellular pertussis (Tdap, Td) vaccine. You may need a Td booster every 10 years. Zoster vaccine. You may need this after age 72. Pneumococcal 13-valent conjugate (PCV13) vaccine. One dose is recommended after age 54. Pneumococcal polysaccharide (PPSV23) vaccine. One dose is recommended after age 50. Talk to your health care provider about which screenings and vaccines you need and how often you need them. This information is not intended to replace advice given to you by your health care provider. Make sure you discuss any questions you have with your health care provider. Document Released: 05/13/2015 Document Revised: 01/04/2016 Document Reviewed: 02/15/2015 Elsevier Interactive Patient Education  2017 [employer] Prevention in the Home Falls can cause injuries. They can happen to people of all ages. There are many things you can do to make your home safe and to help prevent falls. What can I do on the outside of my home? Regularly fix the edges of walkways and driveways and fix any cracks. Remove anything that might make you trip as you walk through a door, such as a raised step or threshold. Trim any bushes or trees on the path to your home. Use bright outdoor lighting. Clear any walking paths of anything that might make someone trip, such as rocks or tools. Regularly check to see if handrails are loose or broken. Make sure that both sides of any steps have handrails. Any raised decks and porches should have guardrails on the edges. Have any leaves, snow, or ice cleared regularly. Use sand or salt on walking paths during winter. Clean up any spills in your garage right away. This includes oil or grease spills. What can I do in the bathroom? Use night lights. Install grab bars by the toilet and in the tub and shower. Do not use towel bars as grab bars. Use non-skid mats or decals in the tub or shower. If you need to sit down in the shower, use a  plastic, non-slip stool. Keep the floor dry. Clean up any water that spills on the floor as soon as it happens. Remove soap buildup in the tub or shower regularly. Attach bath mats securely with double-sided non-slip rug tape. Do not have throw rugs and other things on the floor that can make you trip. What can I do in the bedroom? Use night lights. Make sure that you have a light by your bed that is easy to reach. Do not use any sheets or blankets that are too big for your bed. They should not hang down onto the floor. Have a firm chair that has side arms. You can use this for support while you get dressed. Do not have throw rugs and other things on the floor that can make you trip. What can I do in the kitchen? Clean up any spills right away. Avoid walking on wet floors. Keep items that you use a lot in easy-to-reach places. If you need to reach something above you, use a strong step stool that has a grab bar. Keep electrical cords out of the way. Do not use floor polish or wax that makes floors slippery. If you must use wax, use non-skid floor wax. Do not have throw rugs and other things on the floor that can make you trip. What can I do with my stairs? Do not leave  any items on the stairs. Make sure that there are handrails on both sides of the stairs and use them. Fix handrails that are broken or loose. Make sure that handrails are as long as the stairways. Check any carpeting to make sure that it is firmly attached to the stairs. Fix any carpet that is loose or worn. Avoid having throw rugs at the top or bottom of the stairs. If you do have throw rugs, attach them to the floor with carpet tape. Make sure that you have a light switch at the top of the stairs and the bottom of the stairs. If you do not have them, ask someone to add them for you. What else can I do to help prevent falls? Wear shoes that: Do not have high heels. Have rubber bottoms. Are comfortable and fit you  well. Are closed at the toe. Do not wear sandals. If you use a stepladder: Make sure that it is fully opened. Do not climb a closed stepladder. Make sure that both sides of the stepladder are locked into place. Ask someone to hold it for you, if possible. Clearly mark and make sure that you can see: Any grab bars or handrails. First and last steps. Where the edge of each step is. Use tools that help you move around (mobility aids) if they are needed. These include: Canes. Walkers. Scooters. Crutches. Turn on the lights when you go into a dark area. Replace any light bulbs as soon as they burn out. Set up your furniture so you have a clear path. Avoid moving your furniture around. If any of your floors are uneven, fix them. If there are any pets around you, be aware of where they are. Review your medicines with your doctor. Some medicines can make you feel dizzy. This can increase your chance of falling. Ask your doctor what other things that you can do to help prevent falls. This information is not intended to replace advice given to you by your health care provider. Make sure you discuss any questions you have with your health care provider. Document Released: 02/10/2009 Document Revised: 09/22/2015 Document Reviewed: 05/21/2014 Elsevier Interactive Patient Education  2017 [employer].

## 2020-10-19 NOTE — Progress Notes (Signed)
Subjective:   Howard Ramos is a 80 y.o. male who presents for Medicare Annual/Subsequent preventive examination.  Virtual Visit via Telephone Note  I connected with  Howard Ramos on 10/19/20 at  8:15 AM EDT by telephone and verified that I am speaking with the correct person using two identifiers.  Location: Patient: Home Provider: [facility] Persons participating in the virtual visit: patient/Nurse Health Advisor   I discussed the limitations, risks, security and privacy concerns of performing an evaluation and management service by telephone and the availability of in person appointments. The patient expressed understanding and agreed to proceed.  Interactive audio and video telecommunications were attempted between this nurse and patient, however failed, due to patient having technical difficulties OR patient did not have access to video capability.  We continued and completed visit with audio only.  Some vital signs may be absent or patient reported.   Lauralyn Shadowens E Dexter Signor, LPN   Review of Systems     Cardiac Risk Factors include: advanced age (>78men, >21 women);male gender;dyslipidemia;smoking/ tobacco exposure     Objective:    Today's Vitals   10/19/20 0846  Weight: 200 lb (90.7 kg)  Height: 5\' 10"  (1.778 m)  PainSc: 0-No pain   Body mass index is 28.7 kg/m.  Advanced Directives 10/19/2020 10/19/2019 10/16/2018 10/23/2013  Does Patient Have a Medical Advance Directive? No No No Patient does not have advance directive;Patient would not like information  Would patient like information on creating a medical advance directive? No - Patient declined No - Patient declined No - Patient declined -  Pre-existing out of facility DNR order (yellow form or pink MOST form) - - - No    Current Medications (verified) Outpatient Encounter Medications as of 10/19/2020  Medication Sig   loperamide (IMODIUM A-D) 2 MG tablet Take 2 tablets after first loose stool, then 1 tablet after each loose  stool. Do not take more than 4 tablets in 24 hours. (Patient not taking: No sig reported)   molnupiravir EUA 200 mg CAPS Take 4 capsules (800 mg total) by mouth 2 (two) times daily for 5 days.   pravastatin (PRAVACHOL) 20 MG tablet Take 1 tablet (20 mg total) by mouth daily. (Patient not taking: No sig reported)   No facility-administered encounter medications on file as of 10/19/2020.    Allergies (verified) Statins   History: Past Medical History:  Diagnosis Date   Cancer ([place])    Prostate   Diabetes mellitus without complication ([place])    elevated hgb A1C- diet controlled   Hyperlipidemia    Prostate cancer [facility])    Past Surgical History:  Procedure Laterality Date   CATARACT EXTRACTION W/PHACO Left 10/29/2013   Procedure: CATARACT EXTRACTION PHACO AND INTRAOCULAR LENS PLACEMENT (IOC);  Surgeon: Tonny Branch, MD;  Location: [facility];  Service: Ophthalmology;  Laterality: Left;  CDE:  15.52   CATARACT EXTRACTION W/PHACO Right 12/07/2013   Procedure: CATARACT EXTRACTION PHACO AND INTRAOCULAR LENS PLACEMENT (IOC);  Surgeon: Tonny Branch, MD;  Location: [facility];  Service: Ophthalmology;  Laterality: Right;  CDE 16.99   ROTATOR CUFF REPAIR Right    Family History  Problem Relation Age of Onset   Hypertension Mother    Cancer Father        Pancreatic    Cancer Sister        Lymphoma   Social History   Socioeconomic History   Marital status: Married    Spouse name: [name]   Number of children: 3   Years  of education: 13   Highest education level: Some college, no degree  Occupational History   Occupation: retired    Comment: part-time as Dealer, painting  Tobacco Use   Smoking status: Former    Packs/day: 1.00    Years: 40.00    Pack years: 40.00    Types: Cigarettes    Quit date: 10/24/2007    Years since quitting: 12.9   Smokeless tobacco: Never   Tobacco comments:    He has annual low dose chest CT at [facility] in [place]  Vaping Use   Vaping Use: Never used  Substance and  Sexual Activity   Alcohol use: No   Drug use: No   Sexual activity: Not Currently    Birth control/protection: None  Other Topics Concern   Not on file  Social History Narrative   He owns 23 acres in [place], garden, farmland, that he maintains.    Stays very active outdoors, but no specific exercise.    Married for over 50 years.    Goes to [facility] in [place] for vaccines and screenings.   Social Determinants of Health   Financial Resource Strain: Low Risk    Difficulty of Paying Living Expenses: Not hard at all  Food Insecurity: No Food Insecurity   Worried About Charity fundraiser in the Last Year: Never true   [place] in the Last Year: Never true  Transportation Needs: No Transportation Needs   Lack of Transportation (Medical): No   Lack of Transportation (Non-Medical): No  Physical Activity: Sufficiently Active   Days of Exercise per Week: 7 days   Minutes of Exercise per Session: 60 min  Stress: No Stress Concern Present   Feeling of Stress : Not at all  Social Connections: Socially Integrated   Frequency of Communication with Friends and Family: More than three times a week   Frequency of Social Gatherings with Friends and Family: More than three times a week   Attends Religious Services: More than 4 times per year   Active Member of [employer] or Organizations: Yes   Attends Music therapist: More than 4 times per year   Marital Status: Married    Tobacco Counseling Counseling given: Not Answered Tobacco comments: He has annual low dose chest CT at [facility] in [place]   Clinical Intake:  Pre-visit preparation completed: Yes  Pain : No/denies pain Pain Score: 0-No pain     BMI - recorded: 28.7 Nutritional Status: BMI 25 -29 Overweight Nutritional Risks: None Diabetes: No  How often do you need to have someone help you when you read instructions, pamphlets, or other written materials from your doctor or pharmacy?: 1 -  Never  Diabetic?No  Interpreter Needed?: No  Information entered by :: Jeris Roser, LPN   Activities of Daily Living In your present state of health, do you have any difficulty performing the following activities: 10/19/2020  Hearing? N  Vision? N  Difficulty concentrating or making decisions? N  Walking or climbing stairs? N  Dressing or bathing? N  Doing errands, shopping? N  Preparing Food and eating ? N  Using the Toilet? N  In the past six months, have you accidently leaked urine? N  Do you have problems with loss of bowel control? N  Managing your Medications? N  Managing your Finances? N  Housekeeping or managing your Housekeeping? N  Some recent data might be hidden    Patient Care Team: Sharion Balloon, FNP as  PCP - General (Family Medicine) Franchot Gallo, MD as Consulting Physician (Urology)  Indicate any recent Medical Services you may have received from other than [facility] providers in the past year (date may be approximate).     Assessment:   This is a routine wellness examination for Howard Ramos.  Hearing/Vision screen Hearing Screening - Comments:: Denies hearing difficulties  Vision Screening - Comments:: Wears eyeglasses prn - up to date with annual eye exams at [facility] in [place] issues and exercise activities discussed: Current Exercise Habits: The patient has a physically strenuous job, but has no regular exercise apart from work., Exercise limited by: respiratory conditions(s)   Goals Addressed             This Visit's Progress    AWV   On track    10/19/2019 AWV Goal: Exercise for General Health  Patient will verbalize understanding of the benefits of increased physical activity: Exercising regularly is important. It will improve your overall fitness, flexibility, and endurance. Regular exercise also will improve your overall health. It can help you control your weight, reduce stress, and improve your bone density. Over the next year,  patient will increase physical activity as tolerated with a goal of at least 150 minutes of moderate physical activity per week.  You can tell that you are exercising at a moderate intensity if your heart starts beating faster and you start breathing faster but can still hold a conversation. Moderate-intensity exercise ideas include: Walking 1 mile (1.6 km) in about 15 minutes Biking Hiking Golfing Dancing Water aerobics Patient will verbalize understanding of everyday activities that increase physical activity by providing examples like the following: Yard work, such as: Sales promotion account executive Gardening Washing windows or floors Patient will be able to explain general safety guidelines for exercising:  Before you start a new exercise program, talk with your health care provider. Do not exercise so much that you hurt yourself, feel dizzy, or get very short of breath. Wear comfortable clothes and wear shoes with good support. Drink plenty of water while you exercise to prevent dehydration or heat stroke. Work out until your breathing and your heartbeat get faster.         Depression Screen PHQ 2/9 Scores 10/19/2020 08/05/2020 10/23/2019 10/19/2019 10/16/2018 07/03/2017 06/27/2017  PHQ - 2 Score 0 0 0 0 0 0 0    Fall Risk Fall Risk  10/19/2020 08/05/2020 10/23/2019 10/19/2019 10/16/2018  Falls in the past year? 0 0 0 0 0  Number falls in past yr: 0 - - - -  Injury with Fall? 0 - - - -  Risk for fall due to : No Fall Risks - - - -  Follow up Falls prevention discussed - - - -    FALL RISK PREVENTION PERTAINING TO THE HOME:  Any stairs in or around the home? Yes  If so, are there any without handrails? No  Home free of loose throw rugs in walkways, pet beds, electrical cords, etc? Yes  Adequate lighting in your home to reduce risk of falls? Yes   ASSISTIVE DEVICES UTILIZED TO PREVENT FALLS:  Life alert? No   Use of a cane, walker or w/c? No  Grab bars in the bathroom? No  Shower chair or bench in shower? Yes  Elevated toilet seat or a handicapped toilet? Yes   TIMED UP AND GO:  Was the test performed? No . Telephonic visit  Cognitive Function: Normal cognitive status assessed by direct observation by this Nurse Health Advisor. No abnormalities found.      6CIT Screen 10/19/2019 10/16/2018  What Year? 0 points 0 points  What month? 0 points 0 points  What time? 0 points 0 points  Count back from 20 0 points 0 points  Months in reverse 0 points 0 points  Repeat phrase 0 points 0 points  Total Score 0 0    Immunizations Immunization History  Administered Date(s) Administered   Fluad Quad(high Dose 65+) 03/18/2020   Meningococcal Conjugate 12/28/2015   Meningococcal Polysaccharide 12/27/2016   Moderna Sars-Covid-2 Vaccination 05/21/2019, 06/18/2019   Tdap 09/22/2013    TDAP status: Up to date  Flu Vaccine status: Up to date  Pneumococcal vaccine status: Up to date  Covid-19 vaccine status: Completed vaccines  Qualifies for Shingles Vaccine? Yes   Zostavax completed No   Shingrix Completed?: Yes  Screening Tests Health Maintenance  Topic Date Due   Hepatitis C Screening  Never done   Zoster Vaccines- Shingrix (1 of 2) Never done   COVID-19 Vaccine (3 - Moderna risk series) 07/16/2019   INFLUENZA VACCINE  11/28/2020   TETANUS/TDAP  09/23/2023   PNA vac Low Risk Adult  Completed   HPV VACCINES  Aged Out    Health Maintenance  Health Maintenance Due  Topic Date Due   Hepatitis C Screening  Never done   Zoster Vaccines- Shingrix (1 of 2) Never done   COVID-19 Vaccine (3 - Moderna risk series) 07/16/2019    Colorectal cancer screening: No longer required.   Lung Cancer Screening: (Low Dose CT Chest recommended if Age 29-80 years, 30 pack-year currently smoking OR have quit w/in 15years.) does qualify.   Lung Cancer Screening Referral:  He gets these done every  year at the [facility]  Additional Screening:  Hepatitis C Screening: does not qualify  Vision Screening: Recommended annual ophthalmology exams for early detection of glaucoma and other disorders of the eye. Is the patient up to date with their annual eye exam?  Yes  Who is the provider or what is the name of the office in which the patient attends annual eye exams? [facility] in [place] If pt is not established with a provider, would they like to be referred to a provider to establish care? No .   Dental Screening: Recommended annual dental exams for proper oral hygiene  Community Resource Referral / Chronic Care Management: CRR required this visit?  No   CCM required this visit?  No      Plan:     I have personally reviewed and noted the following in the patient's chart:   Medical and social history Use of alcohol, tobacco or illicit drugs  Current medications and supplements including opioid prescriptions. Patient is not currently taking opioid prescriptions. Functional ability and status Nutritional status Physical activity Advanced directives List of other physicians Hospitalizations, surgeries, and ER visits in previous 12 months Vitals Screenings to include cognitive, depression, and falls Referrals and appointments  In addition, I have reviewed and discussed with patient certain preventive protocols, quality metrics, and best practice recommendations. A written personalized care plan for preventive services as well as general preventive health recommendations were provided to patient.     Sandrea Hammond, LPN   2/84/1324   Nurse Notes: None

## 2020-11-15 DIAGNOSIS — B078 Other viral warts: Secondary | ICD-10-CM | POA: Diagnosis not present

## 2020-11-15 DIAGNOSIS — X32XXXD Exposure to sunlight, subsequent encounter: Secondary | ICD-10-CM | POA: Diagnosis not present

## 2020-11-15 DIAGNOSIS — L570 Actinic keratosis: Secondary | ICD-10-CM | POA: Diagnosis not present

## 2021-03-28 ENCOUNTER — Other Ambulatory Visit: Payer: Medicare Other

## 2021-03-28 ENCOUNTER — Other Ambulatory Visit: Payer: Self-pay

## 2021-03-28 DIAGNOSIS — C61 Malignant neoplasm of prostate: Secondary | ICD-10-CM

## 2021-03-29 LAB — PSA: Prostate Specific Ag, Serum: 11.9 ng/mL — ABNORMAL HIGH (ref 0.0–4.0)

## 2021-04-04 ENCOUNTER — Encounter: Payer: Self-pay | Admitting: Urology

## 2021-04-04 ENCOUNTER — Ambulatory Visit: Payer: Medicare Other | Admitting: Urology

## 2021-04-04 ENCOUNTER — Other Ambulatory Visit: Payer: Self-pay

## 2021-04-04 VITALS — BP 177/90 | HR 69

## 2021-04-04 DIAGNOSIS — C61 Malignant neoplasm of prostate: Secondary | ICD-10-CM | POA: Diagnosis not present

## 2021-04-04 LAB — URINALYSIS, ROUTINE W REFLEX MICROSCOPIC
Bilirubin, UA: NEGATIVE
Glucose, UA: NEGATIVE
Leukocytes,UA: NEGATIVE
Nitrite, UA: NEGATIVE
RBC, UA: NEGATIVE
Specific Gravity, UA: 1.02 (ref 1.005–1.030)
Urobilinogen, Ur: 4 mg/dL — ABNORMAL HIGH (ref 0.2–1.0)
pH, UA: 7 (ref 5.0–7.5)

## 2021-04-04 NOTE — Progress Notes (Signed)

## 2021-04-04 NOTE — Progress Notes (Signed)
History of Present Illness: Here for follow-up of prostate cancer.  TRUS/Bx on 5.8.2018. At that time, PSA was 5.7, prostatic volume 42 mL, PSA density 0.14. 1/12 cores positive for adenocarcinoma, GS 3+3 pattern. The positive core was at the right base lateral, 10% was involved with cancer.   He decided on active surveillance.   1.14.2020: Most recent PSA 7.2 (1.6.2020). He has had no recent urologic issues. His voiding is still excellent.   6.30.2020: Prostate MRI--volume 35.35 ml. No evidence of trans capsular/seminal vesicle/neurovascular bundle involvement, no evidence of pelvic adenopathy or bony metastatic disease. 2 ROIs--  #1--PIRADS 4 lesion Rt posterolateral peropheral zone 18x11x14 ml.  #2--PIRADS 3 lesion Lt apical posterolateral peripheral zone 8x5x5 ml.   8.17.2020: Fusion TRUS/Bx. Prostate volume 30 ml. 1/12 systematic cores + for PCA--GS 3+4 in 20% of core from Rt apex medial. 6 cores from 2 ROI's all benign.   3.8.2022: PSA 14.3.   6.8.2022 : PSA 11.5.  He has had no urologic issues over the past few months.  Not on any urologic medications.  Past Medical History:  Diagnosis Date   Cancer [facility])    Prostate   Diabetes mellitus without complication ([place])    elevated hgb A1C- diet controlled   Hyperlipidemia    Prostate cancer [facility])     Past Surgical History:  Procedure Laterality Date   CATARACT EXTRACTION W/PHACO Left 10/29/2013   Procedure: CATARACT EXTRACTION PHACO AND INTRAOCULAR LENS PLACEMENT (IOC);  Surgeon: Tonny Branch, MD;  Location: [facility];  Service: Ophthalmology;  Laterality: Left;  CDE:  15.52   CATARACT EXTRACTION W/PHACO Right 12/07/2013   Procedure: CATARACT EXTRACTION PHACO AND INTRAOCULAR LENS PLACEMENT (IOC);  Surgeon: Tonny Branch, MD;  Location: [facility];  Service: Ophthalmology;  Laterality: Right;  CDE 16.99   ROTATOR CUFF REPAIR Right     Home Medications:  Allergies as of 04/04/2021       Reactions   Statins Other (See Comments)   Body aches         Medication List        Accurate as of April 04, 2021  8:35 AM. If you have any questions, ask your nurse or doctor.          loperamide 2 MG tablet Commonly known as: IMODIUM A-D Take 2 tablets after first loose stool, then 1 tablet after each loose stool. Do not take more than 4 tablets in 24 hours.   pravastatin 20 MG tablet Commonly known as: PRAVACHOL Take 1 tablet (20 mg total) by mouth daily.        Allergies:  Allergies  Allergen Reactions   Statins Other (See Comments)    Body aches    Family History  Problem Relation Age of Onset   Hypertension Mother    Cancer Father        Pancreatic    Cancer Sister        Lymphoma    Social History:  reports that he quit smoking about 13 years ago. His smoking use included cigarettes. He has a 40.00 pack-year smoking history. He has never used smokeless tobacco. He reports that he does not drink alcohol and does not use drugs.  ROS: A complete review of systems was performed.  All systems are negative except for pertinent findings as noted.  Physical Exam:  Vital signs in last 24 hours: There were no vitals taken for this visit. Constitutional:  Alert and oriented, No acute distress Cardiovascular: Regular rate  Respiratory: Normal  respiratory effort GI: Abdomen is soft, nontender, nondistended, no abdominal masses. No CVAT.  Genitourinary: Normal anal sphincter tone.  Prostate 40 mL, symmetrical, nonnodular, nontender. Lymphatic: No lymphadenopathy Neurologic: Grossly intact, no focal deficits Psychiatric: Normal mood and affect  I have reviewed prior pt notes  I have reviewed urinalysis results  I have independently reviewed prior imaging--MRI results  I have reviewed prior PSA results    Impression/Assessment:  Low-volume favorable intermediate risk prostate cancer, on active surveillance with stable DRE/PSA.  Last biopsy 2-1/2 years ago  Plan:  I will have him come back in 6 months  following PSA and MRI.

## 2021-05-16 DIAGNOSIS — S70361A Insect bite (nonvenomous), right thigh, initial encounter: Secondary | ICD-10-CM | POA: Diagnosis not present

## 2021-05-16 DIAGNOSIS — X32XXXD Exposure to sunlight, subsequent encounter: Secondary | ICD-10-CM | POA: Diagnosis not present

## 2021-05-16 DIAGNOSIS — L570 Actinic keratosis: Secondary | ICD-10-CM | POA: Diagnosis not present

## 2021-06-08 ENCOUNTER — Other Ambulatory Visit: Payer: Self-pay

## 2021-06-08 ENCOUNTER — Ambulatory Visit (HOSPITAL_COMMUNITY)
Admission: RE | Admit: 2021-06-08 | Discharge: 2021-06-08 | Disposition: A | Discharge status: Home / Self Care | Payer: Medicare Other | Source: Ambulatory Visit | Attending: Urology | Admitting: Urology

## 2021-06-08 DIAGNOSIS — C61 Malignant neoplasm of prostate: Secondary | ICD-10-CM | POA: Insufficient documentation

## 2021-06-08 DIAGNOSIS — K5730 Diverticulosis of large intestine without perforation or abscess without bleeding: Secondary | ICD-10-CM | POA: Diagnosis not present

## 2021-06-08 IMAGING — MR MR PROSTATE WO/W CM
15 series · 48 of 48 positions shown · IV contrast (9 GADAVIST)
Comparison: [DATE]

CLINICAL DATA: Prostate carcinoma, Gleason score 6. Active
surveillance.

EXAM:
MR PROSTATE WITHOUT AND WITH CONTRAST
TECHNIQUE: Multiplanar multisequence MRI images were obtained of the pelvis
centered about the prostate. Pre and post contrast images were
obtained.
CONTRAST:  9mL GADAVIST GADOBUTROL 1 MMOL/ML IV SOLN

[Series 3: T1 · axial · 5.0mm · 1.19mm/px · 1 of 72 slices shown (1 of 2)]
[im 1/72]
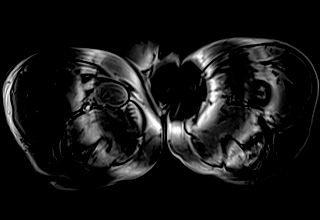

[Series 4: T1 · axial · 5.0mm · 1.19mm/px · z∈[-83,+272]mm · 2 of 72 slices shown (2 of 2)]
[im 1/72]
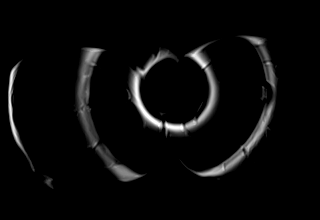
[im 72/72]
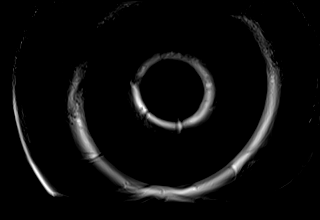

[Series 5: T2 · axial · 3.0mm · 0.29mm/px · 1 of 30 slices shown (1 of 3)]
[im 1/30]
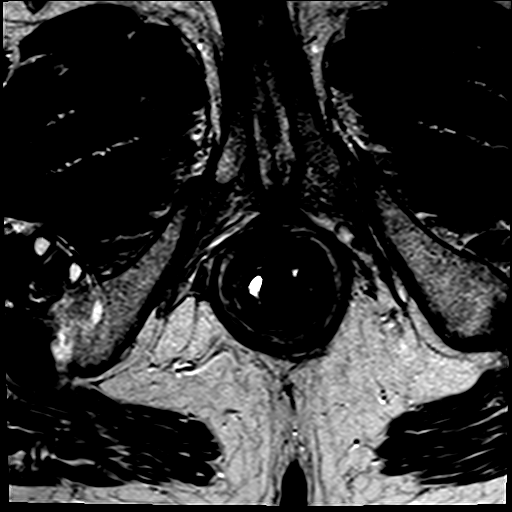

[Series 6: T2 · coronal · 3.0mm · 0.47mm/px · 1 of 22 slices shown (2 of 3)]
[im 1/22]
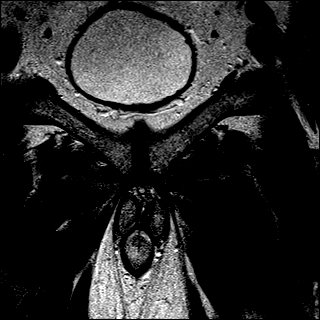

[Series 7: T2 · axial · 1.0mm · 1.00mm/px · z∈[-15,+55]mm · 2 of 72 slices shown (3 of 3)]
[im 1/72]
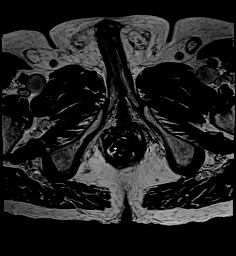
[im 72/72]
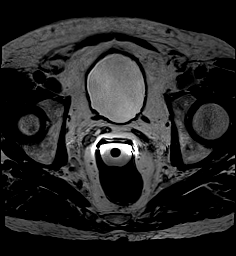

[Series 8: ep2d_diff_b100_500_800_tra_endo**_tracew_dfc_mix · axial · 3.0mm · 1.60mm/px · z∈[-19,+60]mm · 2 of 84 slices shown]
[im 1/84]
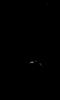
[im 84/84]
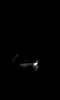

[Series 9: ep2d_diff_b100_500_800_tra_endo**_adc_dfc_mix · axial · 3.0mm · 1.60mm/px · 1 of 28 slices shown]
[im 1/28]
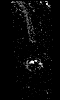

[Series 10: ep2d_diff_b100_500_800_tra_endo**_calc_bval_dfc_mix · axial · 3.0mm · 1.60mm/px · 1 of 26 slices shown]
[im 1/26]
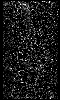

[Series 11: ep2d_diff_bvalue (id) · axial · 3.0mm · 1.60mm/px · 1 of 28 slices shown]
[im 1/28]
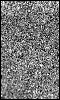

[Series 12: axial multiphase · axial · 3.0mm · 0.98mm/px · z∈[-19,+74]mm · 16 of 640 slices shown]
[im 1/640]
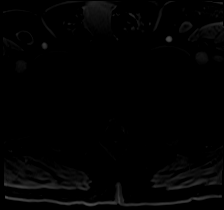
[im 43/640]
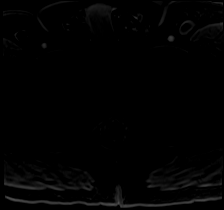
[im 86/640]
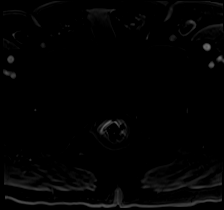
[im 128/640]
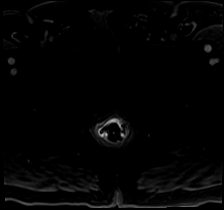
[im 171/640]
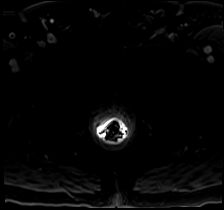
[im 214/640]
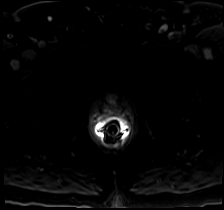
[im 256/640]
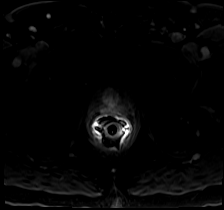
[im 299/640]
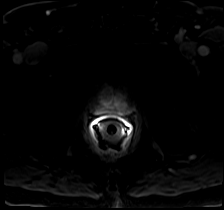
[im 341/640]
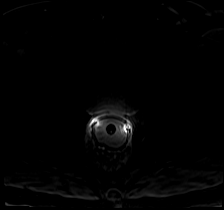
[im 384/640]
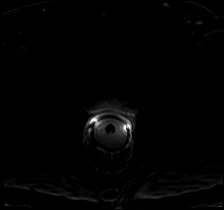
[im 427/640]
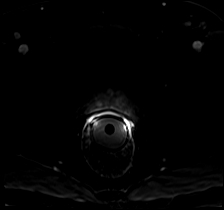
[im 469/640]
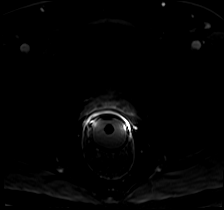
[im 512/640]
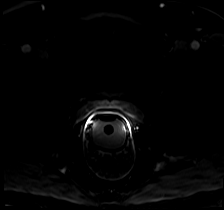
[im 554/640]
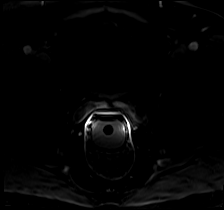
[im 597/640]
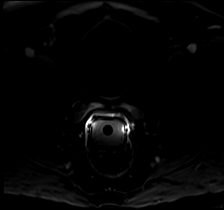
[im 640/640]
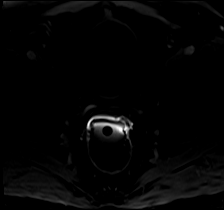

[Series 13: axial multiphase_sub · axial · 3.0mm · 0.98mm/px · z∈[-19,+74]mm · 15 of 604 slices shown]
[im 1/604]
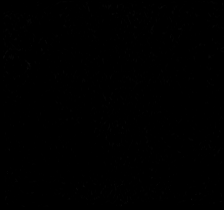
[im 44/604]
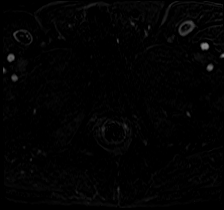
[im 87/604]
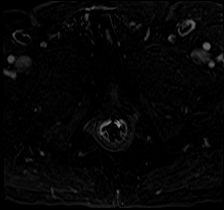
[im 130/604]
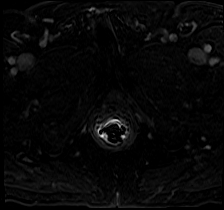
[im 173/604]
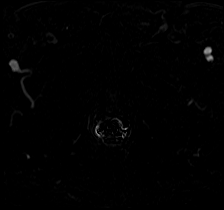
[im 216/604]
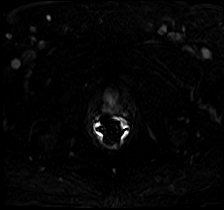
[im 259/604]
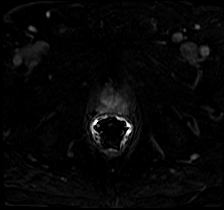
[im 302/604]
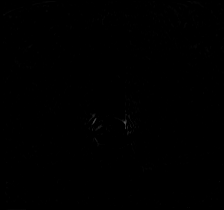
[im 345/604]
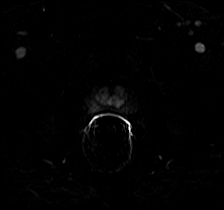
[im 388/604]
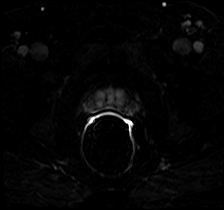
[im 431/604]
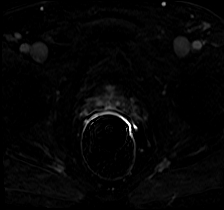
[im 474/604]
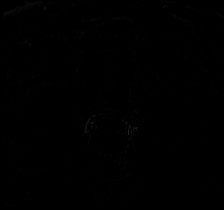
[im 517/604]
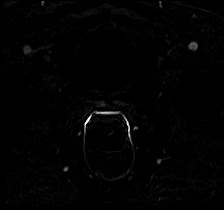
[im 560/604]
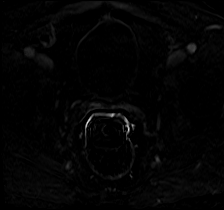
[im 604/604]
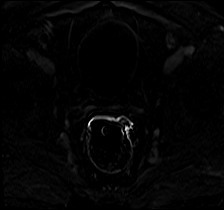

[Series 15: iliac crest thru · axial · 2.5mm · 1.19mm/px · z∈[-44,+193]mm · 2 of 96 slices shown]
[im 1/96]
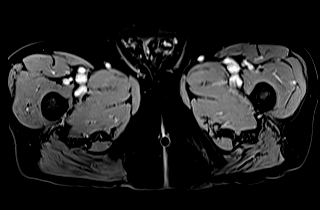
[im 96/96]
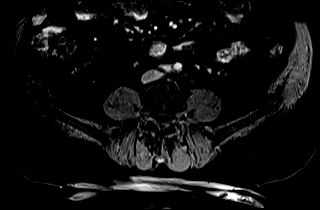

[Series 100: <mpr range> · axial · 1.0mm · 1.00mm/px · 1 of 25 slices shown]
[im 1/25]
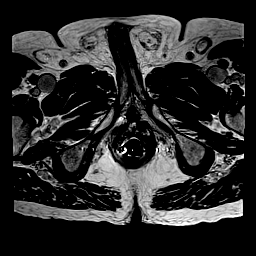

[Series 101: <mpr range(1)> · axial · 1.0mm · 0.53mm/px · 1 of 50 slices shown]
[im 1/50]
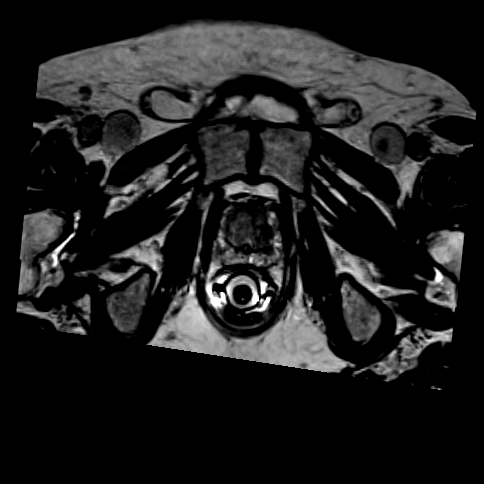

[Series 102: <mpr range(2)> · axial · 1.0mm · 0.53mm/px · 1 of 43 slices shown]
[im 1/43]
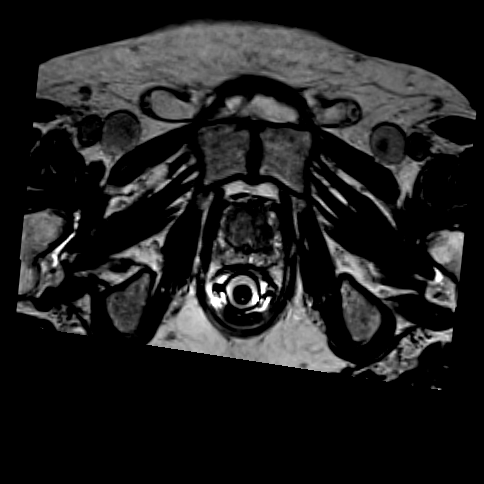

[48 of 48 positions shown; findings below may reference images not displayed]

FINDINGS: Prostate:

-- Peripheral Zone: Linear/wedge shaped hypointensities are noted on
ADC. An 7 x 6 mm T2 hypointense nodule is seen in the left
posterolateral mid gland on image [DATE]. This shows marked ADC
hypointensity and early focal contrast enhancement, but lack of DWI
hyperintensity. No other suspicious nodules identified on today's
exam.

-- Transition/Central Zone: Mildly enlarged with encapsulated BPH
nodules. No suspicious nodules identified on T2-weighted or
diffusion sequences.

-- Measurements/Volume:  4.7 by 3.1 x 5.8 cm (volume = 44 cm^3)

Transcapsular spread:  Absent

Seminal vesicle involvement:  Absent

Neurovascular bundle involvement:  Absent

Pelvic adenopathy: None visualized

Bone metastasis: None visualized

Other: Sigmoid diverticulosis is seen, without evidence of
diverticulitis.
IMPRESSION: 7 mm peripheral zone nodule in the left posterolateral mid gland
shows no significant change, but remains suspicious for high-grade
carcinoma. PI-RADS 4 (v2.1): High (clinically significant cancer
likely)

No evidence of extracapsular extension or pelvic metastatic disease.

(I have post-processed this exam in the DynaCAD application for
potential fusion-guided biopsy.)

## 2021-06-08 MED ORDER — GADOBUTROL 1 MMOL/ML IV SOLN
9.0000 mL | Freq: Once | INTRAVENOUS | Status: AC | PRN
  Administered 2021-06-08: 9 mL via INTRAVENOUS

## 2021-06-13 DIAGNOSIS — S70361D Insect bite (nonvenomous), right thigh, subsequent encounter: Secondary | ICD-10-CM | POA: Diagnosis not present

## 2021-07-18 ENCOUNTER — Other Ambulatory Visit: Payer: Medicare Other

## 2021-07-18 ENCOUNTER — Other Ambulatory Visit: Payer: Self-pay

## 2021-07-18 DIAGNOSIS — C61 Malignant neoplasm of prostate: Secondary | ICD-10-CM

## 2021-07-19 LAB — PSA: Prostate Specific Ag, Serum: 14.5 ng/mL — ABNORMAL HIGH (ref 0.0–4.0)

## 2021-07-25 ENCOUNTER — Ambulatory Visit: Payer: Medicare Other | Admitting: Urology

## 2021-07-25 ENCOUNTER — Encounter: Payer: Self-pay | Admitting: Urology

## 2021-07-25 ENCOUNTER — Other Ambulatory Visit: Payer: Self-pay

## 2021-07-25 VITALS — BP 161/91 | HR 73

## 2021-07-25 DIAGNOSIS — C61 Malignant neoplasm of prostate: Secondary | ICD-10-CM | POA: Diagnosis not present

## 2021-07-25 NOTE — Progress Notes (Signed)
?History of Present Illness: This gentleman is here today for follow-up of prostate cancer, for continuing active surveillance. ? ? ?TRUS/Bx on 5.8.2018. At that time, PSA was 5.7, prostatic volume 42 mL, PSA density 0.14. 1/12 cores positive for adenocarcinoma, GS 3+3 pattern. The positive core was at the right base lateral, 10% was involved with cancer.  ? ?He decided on active surveillance.  ? ?1.14.2020: Most recent PSA 7.2 (1.6.2020). He has had no recent urologic issues. His voiding is still excellent.  ? ?6.30.2020: Prostate MRI--volume 35.35 ml. No evidence of trans capsular/seminal vesicle/neurovascular bundle involvement, no evidence of pelvic adenopathy or bony metastatic disease. 2 ROIs--  ?#1--PIRADS 4 lesion Rt posterolateral peropheral zone 18x11x14 ml.  ?#2--PIRADS 3 lesion Lt apical posterolateral peripheral zone 8x5x5 ml.  ? ?8.17.2020: Fusion TRUS/Bx. Prostate volume 30 ml. 1/12 systematic cores + for PCA--GS 3+4 in 20% of core from Rt apex medial. 6 cores from 2 ROI's all benign. ?  ?3.8.2022: PSA 14.3. ?  ?6.8.2022 : PSA 11.5.   ? ?2.9.2023: MRI of prostate ?  ?FINDINGS: ?Prostate: ?  ?-- Peripheral Zone: Linear/wedge shaped hypointensities are noted on ?ADC. An 7 x 6 mm T2 hypointense nodule is seen in the left ?posterolateral mid gland on image 18/5. This shows marked ADC ?hypointensity and early focal contrast enhancement, but lack of DWI ?hyperintensity. No other suspicious nodules identified on today's ?exam. ?-- Transition/Central Zone: Mildly enlarged with encapsulated BPH ?nodules. No suspicious nodules identified on T2-weighted or ?diffusion sequences. ?-- Measurements/Volume:  4.7 by 3.1 x 5.8 cm (volume = 44 cm^3) ?Transcapsular spread:  Absent ?Seminal vesicle involvement:  Absent ?Neurovascular bundle involvement:  Absent ?Pelvic adenopathy: None visualized ?Bone metastasis: None visualized ?Other: Sigmoid diverticulosis is seen, without evidence of ?diverticulitis ?IMPRESSION: ?7  mm peripheral zone nodule in the left posterolateral mid gland ?shows no significant change, but remains suspicious for high-grade ?carcinoma. PI-RADS 4 (v2.1): High (clinically significant cancer ?likely) ?No evidence of extracapsular extension or pelvic metastatic disease. ? ?3.28.2023: PSA 14.5.  He is seeing the results of both his PSA and MRI.  At this point, he would like to hold off on repeat biopsy.  He feels comfortable without repeat biopsy at this time.  He is having no significant complaints. ?  ?Past Medical History:  ?Diagnosis Date  ? Cancer [facility])   ? Prostate  ? Diabetes mellitus without complication ([place])   ? elevated hgb A1C- diet controlled  ? Hyperlipidemia   ? Prostate cancer ([place])   ? ? ?Past Surgical History:  ?Procedure Laterality Date  ? CATARACT EXTRACTION W/PHACO Left 10/29/2013  ? Procedure: CATARACT EXTRACTION PHACO AND INTRAOCULAR LENS PLACEMENT (IOC);  Surgeon: Tonny Branch, MD;  Location: [facility];  Service: Ophthalmology;  Laterality: Left;  CDE:  15.52  ? CATARACT EXTRACTION W/PHACO Right 12/07/2013  ? Procedure: CATARACT EXTRACTION PHACO AND INTRAOCULAR LENS PLACEMENT (IOC);  Surgeon: Tonny Branch, MD;  Location: [facility];  Service: Ophthalmology;  Laterality: Right;  CDE 16.99  ? ROTATOR CUFF REPAIR Right   ? ? ?Home Medications:  ?Allergies as of 07/25/2021   ? ?   Reactions  ? Statins Other (See Comments)  ? Body aches  ? ?  ? ?  ?Medication List  ?  ?as of July 25, 2021  8:04 AM ?  ?You have not been prescribed any medications. ?  ? ? ?Allergies:  ?Allergies  ?Allergen Reactions  ? Statins Other (See Comments)  ?  Body aches  ? ? ?Family History  ?  Problem Relation Age of Onset  ? Hypertension Mother   ? Cancer Father   ?     Pancreatic   ? Cancer Sister   ?     Lymphoma  ? ? ?Social History:  reports that he quit smoking about 13 years ago. His smoking use included cigarettes. He has a 40.00 pack-year smoking history. He has never used smokeless tobacco. He reports that he does not drink  alcohol and does not use drugs. ? ?ROS: ?A complete review of systems was performed.  All systems are negative except for pertinent findings as noted. ? ?Physical Exam:  ?Vital signs in last 24 hours: ?There were no vitals taken for this visit. ?Constitutional:  Alert and oriented, No acute distress ?Cardiovascular: Regular rate  ?Respiratory: Normal respiratory effort  ?Genitourinary: Prostate was 50 g, symmetric, nonnodular, nontender.  No anal sphincter tone abnormalities ?Neurologic: Grossly intact, no focal deficits ?Psychiatric: Normal mood and affect ? ?I have reviewed prior pt notes ? ?I have reviewed notes from referring/previous physicians ? ?I have reviewed urinalysis results ? ?I have independently reviewed prior imaging--recent MRI ? ?I have reviewed PSA results ? ? ? ?Impression/Assessment:  ?Prostate cancer, favorable intermediate risk, on active surveillance.  Recent PSA is a bit high but fairly stable.  DRE unremarkable, most recent MRI performed last month revealed only 1 region of interest. ? ?Plan:  ?1.  I did discuss with the patient moving forward with another fusion biopsy for continued PSA/RI surveillance without biopsy.  He preferred not to proceed with repeat biopsy ? ?2.  I will bring him back in 6 months following PSA ? ?

## 2021-08-24 ENCOUNTER — Ambulatory Visit (INDEPENDENT_AMBULATORY_CARE_PROVIDER_SITE_OTHER): Payer: Medicare Other

## 2021-08-24 ENCOUNTER — Encounter: Payer: Self-pay | Admitting: Family Medicine

## 2021-08-24 ENCOUNTER — Ambulatory Visit (INDEPENDENT_AMBULATORY_CARE_PROVIDER_SITE_OTHER): Payer: Medicare Other | Admitting: Family Medicine

## 2021-08-24 VITALS — BP 130/78 | HR 71 | Temp 98.0°F | Ht 70.0 in | Wt 205.1 lb

## 2021-08-24 DIAGNOSIS — M5441 Lumbago with sciatica, right side: Secondary | ICD-10-CM | POA: Diagnosis not present

## 2021-08-24 DIAGNOSIS — M545 Low back pain, unspecified: Secondary | ICD-10-CM | POA: Diagnosis not present

## 2021-08-24 IMAGING — DX DG LUMBAR SPINE 2-3V
2 series · 2 of 2 positions shown · non-contrast
Comparison: None.

CLINICAL DATA: Acute lower back pain.  Fall 2 weeks ago.

EXAM:
LUMBAR SPINE - 2-3 VIEW

[l-spine ap]
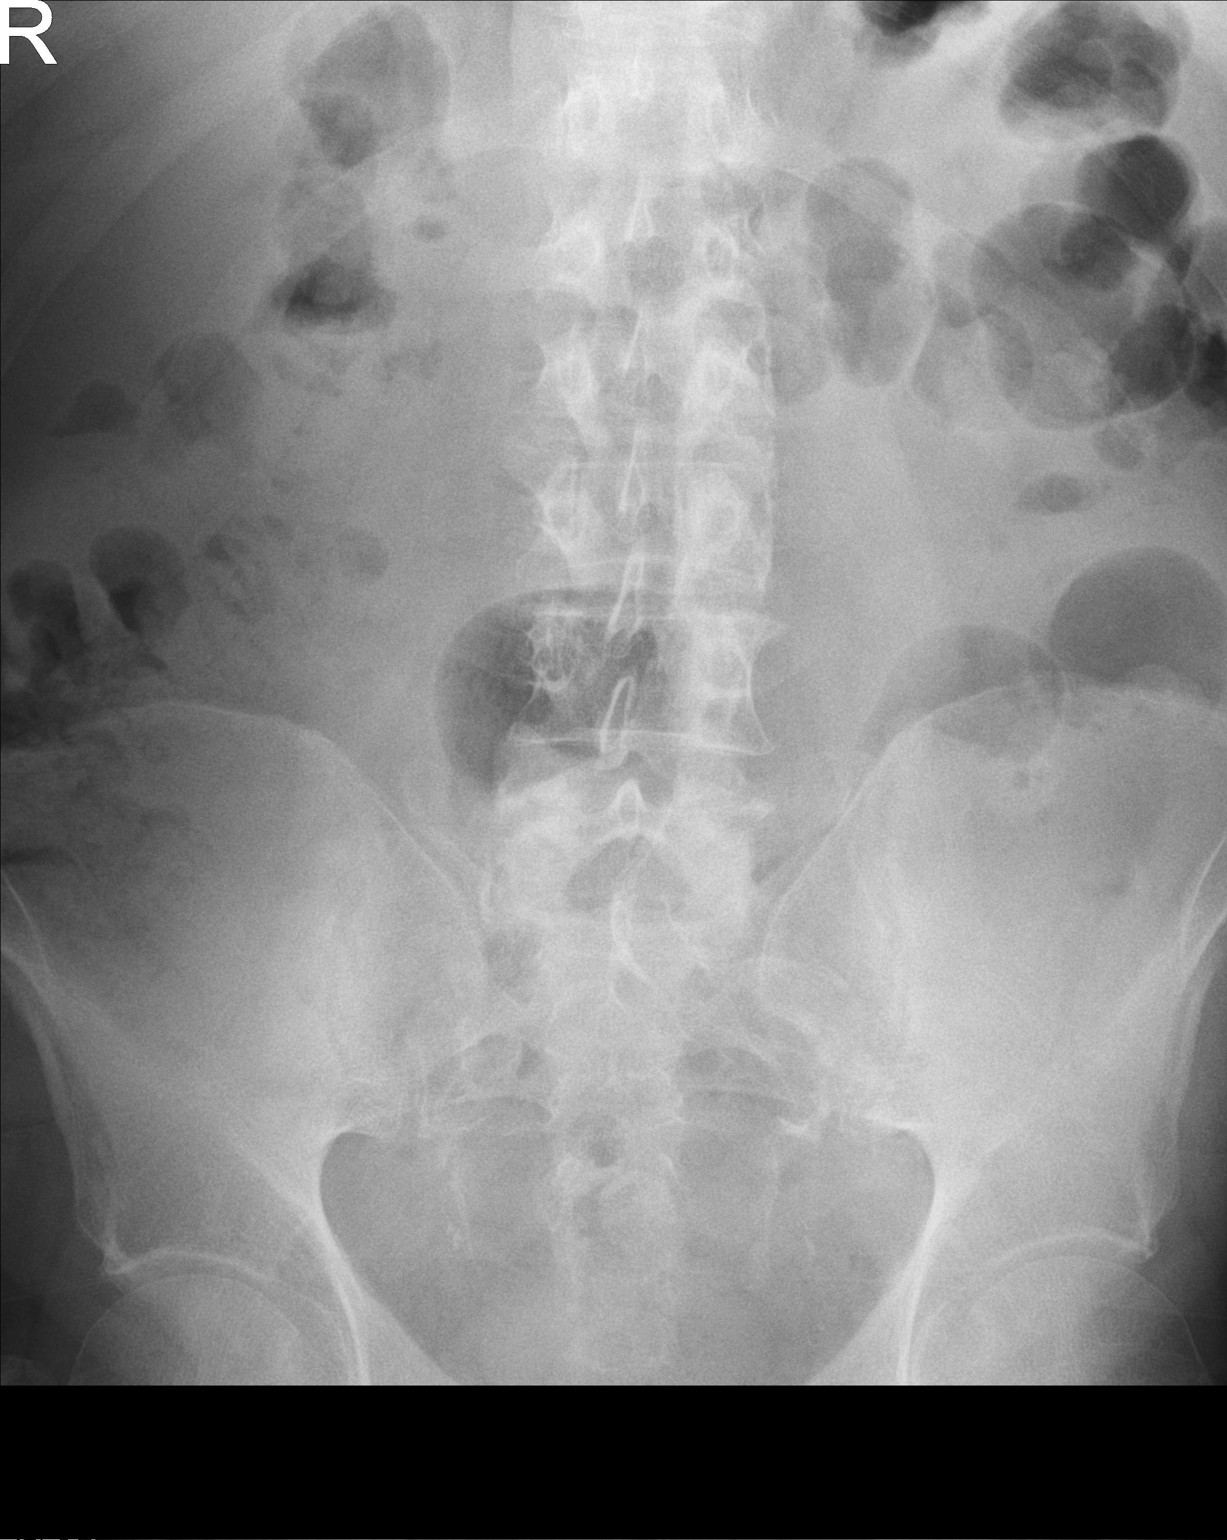

[l-spine lat]
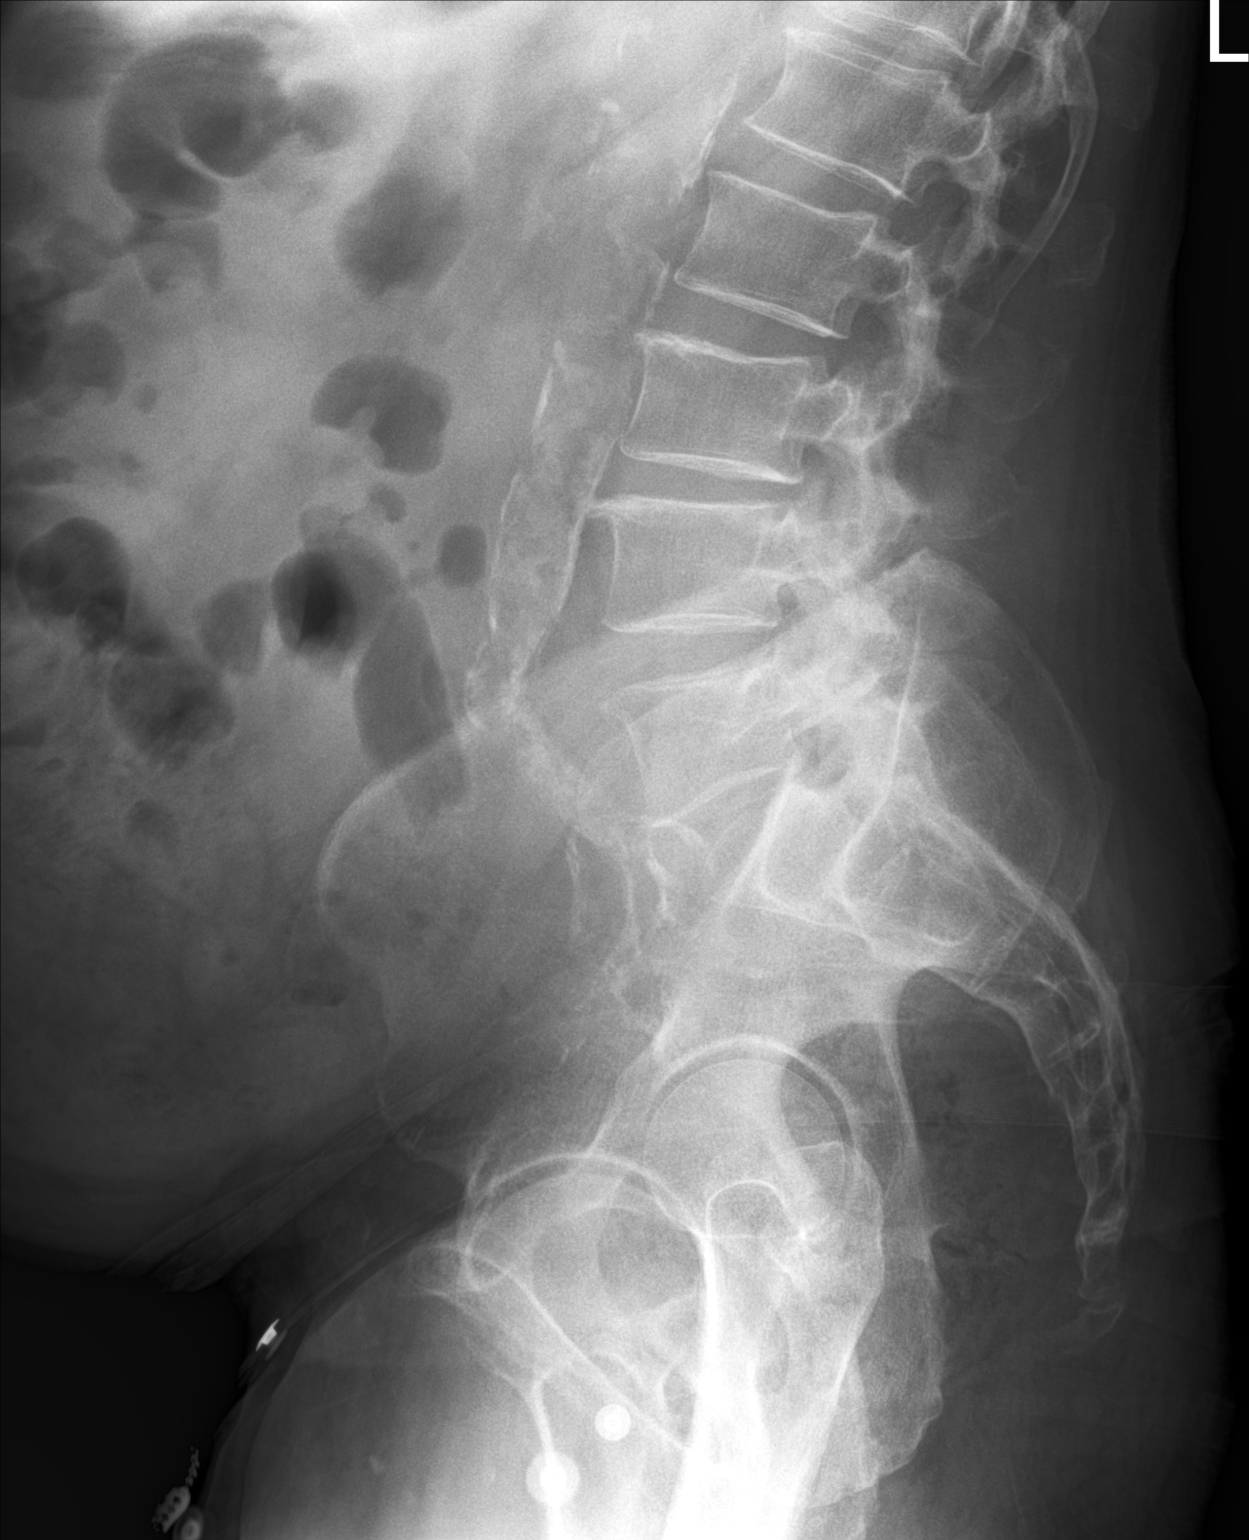

[2 of 2 positions shown; findings below may reference images not displayed]

FINDINGS: There are 5 non-rib-bearing lumbar-type vertebral bodies. Normal
frontal alignment. Mild posterior L5-S1 and minimal posterior L3-4
disc space narrowing. Vertebral body heights are maintained. Mild
anterior endplate osteophytes greatest at the L3-4 level. Facet
arthropathy is greatest at L5-S1 and L4-5.

The bilateral sacroiliac joint spaces are maintained.

Moderate atherosclerotic and iliac vascular calcifications.
IMPRESSION: Mild multilevel degenerative disc changes. Facet joint arthropathy
greatest at L5-S1.

## 2021-08-24 MED ORDER — MELOXICAM 7.5 MG PO TABS: 7.5000 mg | ORAL_TABLET | Freq: Every day | ORAL | 0 refills | Status: DC

## 2021-08-24 MED ORDER — METHYLPREDNISOLONE ACETATE 40 MG/ML IJ SUSP
40.0000 mg | Freq: Once | INTRAMUSCULAR | Status: AC
  Administered 2021-08-24: 40 mg via INTRAMUSCULAR

## 2021-08-24 NOTE — Progress Notes (Signed)
? ?  Acute Office Visit ? ?Subjective:  ? ?  ?Patient ID: Howard Ramos, male    DOB: 1940-08-13, 81 y.o.   MRN: [id number] ? ?Chief Complaint  ?Patient presents with  ? Back Pain  ? ? ?HPI ?Patient is in today for back pain. The pain started 3 days ago. It is across his lower back and is worse on the right side. The pain is moderate. He reports that the muscle feels sore. The pain is constant, achy. It is sharp at times and radiates down his right leg. He die have a fall 2 weeks ago and landed on his right side. He has taken aspirin for the pain. Denies fever, numbness, tingling, changes in bowel or bladder control, or changes in gait.  ? ?ROS ?As per HPI.  ? ?   ?Objective:  ?  ?BP 130/78 Comment: at home reading per pt  Pulse 71   Temp 98 ?F (36.7 ?C) (Temporal)   Ht '5\' 10"'$  (1.778 m)   Wt 205 lb 2 oz (93 kg)   BMI 29.43 kg/m?  ? ? ?Physical Exam ?Vitals and nursing note reviewed.  ?Constitutional:   ?   General: He is not in acute distress. ?   Appearance: He is not ill-appearing, toxic-appearing or diaphoretic.  ?Musculoskeletal:  ?   Lumbar back: Tenderness (paraspinal) present. No swelling, edema, deformity or bony tenderness. Normal range of motion. Positive right straight leg raise test. Negative left straight leg raise test.  ?   Right lower leg: No edema.  ?   Left lower leg: No edema.  ?Skin: ?   General: Skin is warm and dry.  ?Neurological:  ?   Mental Status: He is alert and oriented to person, place, and time.  ?   Sensory: No sensory deficit.  ?   Motor: No weakness.  ?   Gait: Gait normal.  ?Psychiatric:     ?   Mood and Affect: Mood normal.     ?   Behavior: Behavior normal.  ? ? ?No results found for any visits on 08/24/21. ? ? ?   ?Assessment & Plan:  ? ?Howard Ramos was seen today for back pain. ? ?Diagnoses and all orders for this visit: ? ?Acute right-sided low back pain with right-sided sciatica ?Xray report pending to rule out possible fracture. Mobic daily for 1 week, no cardiac or renal history.  Do not take other NSAIDs with mobic. Steroid IM injection today in the office. Return to office for new or worsening symptoms, or if symptoms persist.  ?-     DG Lumbar Spine 2-3 Views; Future ?-     meloxicam (MOBIC) 7.5 MG tablet; Take 1 tablet (7.5 mg total) by mouth daily. ?-     methylPREDNISolone acetate (DEPO-MEDROL) injection 40 mg ? ?Return if symptoms worsen or fail to improve. ? ?The patient indicates understanding of these issues and agrees with the plan. ? ?Howard Perking, FNP ? ? ?

## 2021-08-24 NOTE — Patient Instructions (Signed)

## 2021-10-20 ENCOUNTER — Ambulatory Visit (INDEPENDENT_AMBULATORY_CARE_PROVIDER_SITE_OTHER): Payer: Medicare Other

## 2021-10-20 VITALS — Wt 205.0 lb

## 2021-10-20 DIAGNOSIS — Z0000 Encounter for general adult medical examination without abnormal findings: Secondary | ICD-10-CM | POA: Diagnosis not present

## 2021-10-20 DIAGNOSIS — Z0271 Encounter for disability determination: Secondary | ICD-10-CM | POA: Insufficient documentation

## 2021-12-12 DIAGNOSIS — L570 Actinic keratosis: Secondary | ICD-10-CM | POA: Diagnosis not present

## 2021-12-12 DIAGNOSIS — X32XXXD Exposure to sunlight, subsequent encounter: Secondary | ICD-10-CM | POA: Diagnosis not present

## 2022-01-17 ENCOUNTER — Other Ambulatory Visit: Payer: Medicare Other

## 2022-01-17 DIAGNOSIS — C61 Malignant neoplasm of prostate: Secondary | ICD-10-CM

## 2022-01-18 LAB — PSA: Prostate Specific Ag, Serum: 16.5 ng/mL — ABNORMAL HIGH (ref 0.0–4.0)

## 2022-01-22 NOTE — Progress Notes (Signed)
History of Present Illness:   here today for follow-up of prostate cancer, for continuing active surveillance.   TRUS/Bx on 5.8.2018. At that time, PSA was 5.7, prostatic volume 42 mL, PSA density 0.14. 1/12 cores positive for adenocarcinoma, GS 3+3 pattern. The positive core was at the right base lateral, 10% was involved with cancer.   He decided on active surveillance.   1.14.2020: Most recent PSA 7.2 (1.6.2020). He has had no recent urologic issues. His voiding is still excellent.   6.30.2020: Prostate MRI--volume 35.35 ml. No evidence of trans capsular/seminal vesicle/neurovascular bundle involvement, no evidence of pelvic adenopathy or bony metastatic disease. 2 ROIs--  #1--PIRADS 4 lesion Rt posterolateral peropheral zone 18x11x14 ml.  #2--PIRADS 3 lesion Lt apical posterolateral peripheral zone 8x5x5 ml.   8.17.2020: Fusion TRUS/Bx. Prostate volume 30 ml. 1/12 systematic cores + for PCA--GS 3+4 in 20% of core from Rt apex medial. 6 cores from 2 ROI's all benign.   3.8.2022: PSA 14.3.   6.8.2022 : PSA 11.5.     2.9.2023: MRI of prostate  IMPRESSION: 7 mm peripheral zone nodule in the left posterolateral mid gland shows no significant change, but remains suspicious for high-grade carcinoma. PI-RADS 4 (v2.1): High (clinically significant cancer likely) No evidence of extracapsular extension or pelvic metastatic disease.   3.28.2023: PSA 14.5.  He is seeing the results of both his PSA and MRI.  At this point, he would like to hold off on repeat biopsy.  He feels comfortable without repeat biopsy at this time.  He is having no significant complaints.  9.26.2023: PSA 16.5.  He is having no lower urinary tract symptoms, gross hematuria, dysuria.  Past Medical History:  Diagnosis Date   Cancer [facility])    Prostate   Diabetes mellitus without complication ([place])    elevated hgb A1C- diet controlled   Hyperlipidemia    Prostate cancer [facility])     Past Surgical History:  Procedure  Laterality Date   CATARACT EXTRACTION W/PHACO Left 10/29/2013   Procedure: CATARACT EXTRACTION PHACO AND INTRAOCULAR LENS PLACEMENT (IOC);  Surgeon: Tonny Branch, MD;  Location: [facility];  Service: Ophthalmology;  Laterality: Left;  CDE:  15.52   CATARACT EXTRACTION W/PHACO Right 12/07/2013   Procedure: CATARACT EXTRACTION PHACO AND INTRAOCULAR LENS PLACEMENT (IOC);  Surgeon: Tonny Branch, MD;  Location: [facility];  Service: Ophthalmology;  Laterality: Right;  CDE 16.99   ROTATOR CUFF REPAIR Right     Home Medications:  Allergies as of 01/23/2022       Reactions   Statins Other (See Comments)   Body aches        Medication List        Accurate as of January 22, 2022  7:59 PM. If you have any questions, ask your nurse or doctor.          aspirin 81 MG chewable tablet Chew by mouth daily.        Allergies:  Allergies  Allergen Reactions   Statins Other (See Comments)    Body aches    Family History  Problem Relation Age of Onset   Hypertension Mother    Cancer Father        Pancreatic    Cancer Sister        Lymphoma    Social History:  reports that he quit smoking about 14 years ago. His smoking use included cigarettes. He has a 40.00 pack-year smoking history. He has never used smokeless tobacco. He reports that he does not drink  alcohol and does not use drugs.  ROS: A complete review of systems was performed.  All systems are negative except for pertinent findings as noted.  Physical Exam:  Vital signs in last 24 hours: There were no vitals taken for this visit. Constitutional:  Alert and oriented, No acute distress Cardiovascular: Regular rate  Respiratory: Normal respiratory effort Genitourinary: Normal anal sphincter tone.  Prostate 40 g, symmetric, nonnodular, nontender. Lymphatic: No lymphadenopathy Neurologic: Grossly intact, no focal deficits Psychiatric: Normal mood and affect  I have reviewed prior pt notes  I have reviewed urinalysis results  I  have independently reviewed prior imaging  I have reviewed prior PSA & pathology results    Impression/Assessment:  Low volume grade group 2 prostate cancer.  On active surveillance.  Stable exam but increasing PSA trend  Plan:  At this point, I told him not to worry.  I like to recheck his PSA in about 3 more months.  If continued upward trend I would recommend fusion biopsy.  If back down, continue surveillance

## 2022-01-23 ENCOUNTER — Ambulatory Visit: Payer: Medicare Other | Admitting: Urology

## 2022-01-23 ENCOUNTER — Encounter: Payer: Self-pay | Admitting: Urology

## 2022-01-23 VITALS — BP 162/82 | HR 77 | Ht 70.0 in | Wt 200.0 lb

## 2022-01-23 DIAGNOSIS — C61 Malignant neoplasm of prostate: Secondary | ICD-10-CM | POA: Diagnosis not present

## 2022-01-24 LAB — URINALYSIS, ROUTINE W REFLEX MICROSCOPIC
Bilirubin, UA: NEGATIVE
Glucose, UA: NEGATIVE
Ketones, UA: NEGATIVE
Leukocytes,UA: NEGATIVE
Nitrite, UA: NEGATIVE
Protein,UA: NEGATIVE
RBC, UA: NEGATIVE
Specific Gravity, UA: 1.015 (ref 1.005–1.030)
Urobilinogen, Ur: 2 mg/dL — ABNORMAL HIGH (ref 0.2–1.0)
pH, UA: 6.5 (ref 5.0–7.5)

## 2022-04-18 ENCOUNTER — Other Ambulatory Visit: Payer: Medicare Other

## 2022-04-19 LAB — PSA: Prostate Specific Ag, Serum: 16.6 ng/mL — ABNORMAL HIGH (ref 0.0–4.0)

## 2022-06-12 DIAGNOSIS — X32XXXD Exposure to sunlight, subsequent encounter: Secondary | ICD-10-CM | POA: Diagnosis not present

## 2022-06-12 DIAGNOSIS — L570 Actinic keratosis: Secondary | ICD-10-CM | POA: Diagnosis not present

## 2022-07-23 NOTE — Progress Notes (Signed)
History of Present Illness:   here today for follow-up of prostate cancer, for continuing active surveillance.     TRUS/Bx on 5.8.2018. At that time, PSA was 5.7, prostatic volume 42 mL, PSA density 0.14. 1/12 cores positive for adenocarcinoma, GS 3+3 pattern. The positive core was at the right base lateral, 10% was involved with cancer.    He decided on active surveillance.    1.14.2020: Most recent PSA 7.2 (1.6.2020). He has had no recent urologic issues. His voiding is still excellent.    6.30.2020: Prostate MRI--volume 35.35 ml. No evidence of trans capsular/seminal vesicle/neurovascular bundle involvement, no evidence of pelvic adenopathy or bony metastatic disease. 2 ROIs--  #1--PIRADS 4 lesion Rt posterolateral peropheral zone 18x11x14 ml.  #2--PIRADS 3 lesion Lt apical posterolateral peripheral zone 8x5x5 ml.    8.17.2020: Fusion TRUS/Bx. Prostate volume 30 ml. 1/12 systematic cores + for PCA--GS 3+4 in 20% of core from Rt apex medial. 6 cores from 2 ROI's all benign.    2.9.2023: MRI of prostate   7 mm peripheral zone nodule in the left posterolateral mid gland shows no significant change, but remains suspicious for high-grade carcinoma. PI-RADS 4 (v2.1): High (clinically significant cancer likely) No evidence of extracapsular extension or pelvic metastatic disease.   3.28.2023: PSA 14.5.  He is seeing the results of both his PSA and MRI.  At this point, he would like to hold off on repeat biopsy.  ts.   9.26.2023: PSA 16.5.  3.26.2024: PSA from December 16.6.  Past Medical History:  Diagnosis Date   Cancer [facility])    Prostate   Diabetes mellitus without complication ([place])    elevated hgb A1C- diet controlled   Hyperlipidemia    Prostate cancer [facility])     Past Surgical History:  Procedure Laterality Date   CATARACT EXTRACTION W/PHACO Left 10/29/2013   Procedure: CATARACT EXTRACTION PHACO AND INTRAOCULAR LENS PLACEMENT (IOC);  Surgeon: Tonny Branch, MD;  Location: [facility];  Service: Ophthalmology;  Laterality: Left;  CDE:  15.52   CATARACT EXTRACTION W/PHACO Right 12/07/2013   Procedure: CATARACT EXTRACTION PHACO AND INTRAOCULAR LENS PLACEMENT (IOC);  Surgeon: Tonny Branch, MD;  Location: [facility] ORS;  Service: Ophthalmology;  Laterality: Right;  CDE 16.99   ROTATOR CUFF REPAIR Right     Home Medications:  Allergies as of 07/24/2022       Reactions   Statins Other (See Comments)   Body aches        Medication List        Accurate as of July 23, 2022  8:21 PM. If you have any questions, ask your nurse or doctor.          aspirin 81 MG chewable tablet Chew by mouth daily.        Allergies:  Allergies  Allergen Reactions   Statins Other (See Comments)    Body aches    Family History  Problem Relation Age of Onset   Hypertension Mother    Cancer Father        Pancreatic    Cancer Sister        Lymphoma    Social History:  reports that he quit smoking about 14 years ago. His smoking use included cigarettes. He has a 40.00 pack-year smoking history. He has never used smokeless tobacco. He reports that he does not drink alcohol and does not use drugs.  ROS: A complete review of systems was performed.  All systems are negative except for pertinent findings as noted.  Physical Exam:  Vital signs in last 24 hours: There were no vitals taken for this visit. Constitutional:  Alert and oriented, No acute distress Cardiovascular: Regular rate  Respiratory: Normal respiratory effort GI: Abdomen is soft, nontender, nondistended, no abdominal masses. No CVAT.  Genitourinary: Normal male phallus, testes are descended bilaterally and non-tender and without masses, scrotum is normal in appearance without lesions or masses, perineum is normal on inspection. Lymphatic: No lymphadenopathy Neurologic: Grossly intact, no focal deficits Psychiatric: Normal mood and affect  I have reviewed prior pt notes  I have reviewed notes from  referring/previous physicians  I have reviewed urinalysis results  I have independently reviewed prior imaging  I have reviewed prior PSA results  I have reviewed prior urine culture   Impression/Assessment:  ***  Plan:  ***

## 2022-07-24 ENCOUNTER — Encounter: Payer: Self-pay | Admitting: Urology

## 2022-07-24 ENCOUNTER — Ambulatory Visit: Payer: Medicare Other | Admitting: Urology

## 2022-07-24 VITALS — BP 148/79 | HR 68 | Ht 70.0 in | Wt 200.0 lb

## 2022-07-24 DIAGNOSIS — C61 Malignant neoplasm of prostate: Secondary | ICD-10-CM | POA: Diagnosis not present

## 2022-07-24 DIAGNOSIS — N400 Enlarged prostate without lower urinary tract symptoms: Secondary | ICD-10-CM

## 2022-07-24 LAB — URINALYSIS, ROUTINE W REFLEX MICROSCOPIC
Bilirubin, UA: NEGATIVE
Glucose, UA: NEGATIVE
Ketones, UA: NEGATIVE
Leukocytes,UA: NEGATIVE
Nitrite, UA: NEGATIVE
Protein,UA: NEGATIVE
RBC, UA: NEGATIVE
Specific Gravity, UA: 1.02 (ref 1.005–1.030)
Urobilinogen, Ur: 1 mg/dL (ref 0.2–1.0)
pH, UA: 7 (ref 5.0–7.5)

## 2022-07-25 LAB — PSA: Prostate Specific Ag, Serum: 20.2 ng/mL — ABNORMAL HIGH (ref 0.0–4.0)

## 2022-08-02 ENCOUNTER — Telehealth: Payer: Self-pay

## 2022-08-02 NOTE — Telephone Encounter (Signed)
-----   Message from Audie Box, [place] sent at 07/31/2022  9:28 AM EDT ----- Can you please follow up with patient regarding Dr. Alan Ripper recommendations.  Thank you. ----- Message ----- From: Franchot Gallo, MD Sent: 07/30/2022   4:45 PM EDT To: Audie Box, CMA  Please call pt--psa now  20. I would recommend another fusion biopsy in [place] office, but since it has been a year since MRI I would prefer to have that before the biopsy. See what he wants to do. ----- Message ----- From: Audie Box, CMA Sent: 07/25/2022   7:47 AM EDT To: Franchot Gallo, MD  Please review.

## 2022-08-02 NOTE — Telephone Encounter (Signed)
Patient is aware of MD recommendation and patient would like to proceed with MRI. Patient voiced that he had MRI previously in [place] a year ago and want to know if MRI will be in [place] or another location. Patient voiced that he would like to do the MRI if possible around April 16-25 due to other appointment scheduled. Patient is aware that a task will be sent to the MD on recommendation. Patient voiced understanding

## 2022-08-10 ENCOUNTER — Other Ambulatory Visit: Payer: Self-pay | Admitting: Urology

## 2022-08-10 DIAGNOSIS — C61 Malignant neoplasm of prostate: Secondary | ICD-10-CM

## 2022-08-22 ENCOUNTER — Ambulatory Visit (HOSPITAL_COMMUNITY)
Admission: RE | Admit: 2022-08-22 | Discharge: 2022-08-22 | Disposition: A | Discharge status: Home / Self Care | Payer: Medicare Other | Source: Ambulatory Visit | Attending: Urology | Admitting: Urology

## 2022-08-22 DIAGNOSIS — C61 Malignant neoplasm of prostate: Secondary | ICD-10-CM | POA: Diagnosis present

## 2022-08-22 MED ORDER — GADOBUTROL 1 MMOL/ML IV SOLN
9.0000 mL | Freq: Once | INTRAVENOUS | Status: AC | PRN
  Administered 2022-08-22: 9 mL via INTRAVENOUS

## 2022-08-28 ENCOUNTER — Telehealth: Payer: Self-pay

## 2022-08-28 ENCOUNTER — Other Ambulatory Visit: Payer: Self-pay | Admitting: Urology

## 2022-08-28 DIAGNOSIS — C61 Malignant neoplasm of prostate: Secondary | ICD-10-CM

## 2022-08-28 NOTE — Telephone Encounter (Signed)
Patient is aware of Dr. Retta Diones recommendation and someone from the [facility] office will reach out to get his biopsy scheduled. Patient is aware there is no evidence of anything unusual outside if his prostate. Patient voiced understanding

## 2022-08-28 NOTE — Telephone Encounter (Signed)
-----   Message from Marcine Matar, MD sent at 08/28/2022 12:09 PM EDT ----- Please call patient-he does have a few suspicious areas that I think we should target with biopsies in [place].  I have put that order in.  Let him know that there is no evidence of anything unusual outside his prostate. ----- Message ----- From: Troy Sine, CMA Sent: 08/24/2022   3:33 PM EDT To: Marcine Matar, MD  Please review

## 2022-09-13 ENCOUNTER — Other Ambulatory Visit: Payer: Self-pay | Admitting: Urology

## 2022-09-13 DIAGNOSIS — C61 Malignant neoplasm of prostate: Secondary | ICD-10-CM

## 2022-10-23 ENCOUNTER — Ambulatory Visit (INDEPENDENT_AMBULATORY_CARE_PROVIDER_SITE_OTHER): Payer: Medicare Other

## 2022-10-23 VITALS — Ht 70.0 in | Wt 188.0 lb

## 2022-10-23 DIAGNOSIS — Z0000 Encounter for general adult medical examination without abnormal findings: Secondary | ICD-10-CM

## 2022-10-23 NOTE — Progress Notes (Signed)
Subjective:   Howard Ramos is a 82 y.o. male who presents for Medicare Annual/Subsequent preventive examination.  Visit Complete: Virtual  I connected with  Lacey Jensen on 10/23/22 by a audio enabled telemedicine application and verified that I am speaking with the correct person using two identifiers.  Patient Location: Home  Provider Location: Home Office  I discussed the limitations of evaluation and management by telemedicine. The patient expressed understanding and agreed to proceed.  Patient Medicare AWV questionnaire was completed by the patient on 10/23/2022; I have confirmed that all information answered by patient is correct and no changes since this date.  Review of Systems     Cardiac Risk Factors include: advanced age (>15men, >79 women);male gender;dyslipidemia     Objective:    Today's Vitals   10/23/22 0819  Weight: 188 lb (85.3 kg)  Height: 5\' 10"  (1.778 m)   Body mass index is 26.98 kg/m.     10/23/2022    8:22 AM 10/20/2021    8:20 AM 10/19/2020    8:49 AM 10/19/2019    8:29 AM 10/16/2018    8:33 AM 10/23/2013   12:35 PM  Advanced Directives  Does Patient Have a Medical Advance Directive? No No No No No Patient does not have advance directive;Patient would not like information  Would patient like information on creating a medical advance directive? No - Patient declined No - Patient declined No - Patient declined No - Patient declined No - Patient declined   Pre-existing out of facility DNR order (yellow form or pink MOST form)      No    Current Medications (verified) Outpatient Encounter Medications as of 10/23/2022  Medication Sig   aspirin 81 MG chewable tablet Chew by mouth daily.   pravastatin (PRAVACHOL) 10 MG tablet Take 10 mg by mouth daily.   No facility-administered encounter medications on file as of 10/23/2022.    Allergies (verified) Statins   History: Past Medical History:  Diagnosis Date   Cancer (HCC)    Prostate   Diabetes  mellitus without complication (HCC)    elevated hgb A1C- diet controlled   Hyperlipidemia    Prostate cancer [facility])    Past Surgical History:  Procedure Laterality Date   CATARACT EXTRACTION W/PHACO Left 10/29/2013   Procedure: CATARACT EXTRACTION PHACO AND INTRAOCULAR LENS PLACEMENT (IOC);  Surgeon: Gemma Payor, MD;  Location: [facility];  Service: Ophthalmology;  Laterality: Left;  CDE:  15.52   CATARACT EXTRACTION W/PHACO Right 12/07/2013   Procedure: CATARACT EXTRACTION PHACO AND INTRAOCULAR LENS PLACEMENT (IOC);  Surgeon: Gemma Payor, MD;  Location: [facility];  Service: Ophthalmology;  Laterality: Right;  CDE 16.99   ROTATOR CUFF REPAIR Right    Family History  Problem Relation Age of Onset   Hypertension Mother    Cancer Father        Pancreatic    Cancer Sister        Lymphoma   Social History   Socioeconomic History   Marital status: Married    Spouse name: Nicole Cella   Number of children: 3   Years of education: 13   Highest education level: Some college, no degree  Occupational History   Occupation: retired    Comment: part-time as Curator, painting  Tobacco Use   Smoking status: Former    Packs/day: 1.00    Years: 40.00    Additional pack years: 0.00    Total pack years: 40.00    Types: Cigarettes    Quit date:  10/24/2007    Years since quitting: 15.0   Smokeless tobacco: Never   Tobacco comments:    He has annual low dose chest [facility] in [facility]  Vaping Use   Vaping Use: Never used  Substance and Sexual Activity   Alcohol use: No   Drug use: No   Sexual activity: Not Currently    Birth control/protection: None  Other Topics Concern   Not on file  Social History Narrative   He owns 23 acres in [place] - orchard, garden, farmland, that he maintains.    Stays very active outdoors, but no specific exercise.    Married for over 50 years.    Goes to [place] in [place] for vaccines and screenings.   Social Determinants of Health   Financial Resource Strain: Low Risk   (10/23/2022)   Overall Financial Resource Strain (CARDIA)    Difficulty of Paying Living Expenses: Not hard at all  Food Insecurity: No Food Insecurity (10/23/2022)   Hunger Vital Sign    Worried About Running Out of Food in the Last Year: Never true    Ran Out of Food in the Last Year: Never true  Transportation Needs: No Transportation Needs (10/23/2022)   PRAPARE - Administrator, Civil Service (Medical): No    Lack of Transportation (Non-Medical): No  Physical Activity: Sufficiently Active (10/23/2022)   Exercise Vital Sign    Days of Exercise per Week: 5 days    Minutes of Exercise per Session: 30 min  Stress: No Stress Concern Present (10/23/2022)   [employer] of Occupational Health - Occupational Stress Questionnaire    Feeling of Stress : Not at all  Social Connections: Socially Integrated (10/23/2022)   Social Connection and Isolation Panel [NHANES]    Frequency of Communication with Friends and Family: More than three times a week    Frequency of Social Gatherings with Friends and Family: More than three times a week    Attends Religious Services: More than 4 times per year    Active Member of [employer] or Organizations: Yes    Attends Engineer, structural: More than 4 times per year    Marital Status: Married    Tobacco Counseling Counseling given: Not Answered Tobacco comments: He has annual low dose chest CT at [place] in [place]   Clinical Intake:  Pre-visit preparation completed: Yes  Pain : No/denies pain     Nutritional Risks: None Diabetes: No  How often do you need to have someone help you when you read instructions, pamphlets, or other written materials from your doctor or pharmacy?: 1 - Never  Interpreter Needed?: No  Information entered by :: Renie Ora, LPN   Activities of Daily Living    10/23/2022    8:22 AM  In your present state of health, do you have any difficulty performing the following activities:  Hearing? 0   Vision? 0  Difficulty concentrating or making decisions? 0  Walking or climbing stairs? 0  Dressing or bathing? 0  Doing errands, shopping? 0  Preparing Food and eating ? N  Using the Toilet? N  In the past six months, have you accidently leaked urine? N  Do you have problems with loss of bowel control? N  Managing your Medications? N  Managing your Finances? N  Housekeeping or managing your Housekeeping? N    Patient Care Team: Junie Spencer, FNP as PCP - General (Family Medicine) Marcine Matar, MD as Consulting Physician (Urology) Nita Sells,  MD (Dermatology)  Indicate any recent Medical Services you may have received from other than [facility] providers in the past year (date may be approximate).     Assessment:   This is a routine wellness examination for Corn[place].  Hearing/Vision screen Vision Screening - Comments:: Wears rx glasses - up to date with routine eye exams with  Dr.Johnson   Dietary issues and exercise activities discussed:     Goals Addressed             This Visit's Progress    AWV   On track    10/20/2021 AWV Goal: Exercise for General Health  Patient will verbalize understanding of the benefits of increased physical activity: Exercising regularly is important. It will improve your overall fitness, flexibility, and endurance. Regular exercise also will improve your overall health. It can help you control your weight, reduce stress, and improve your bone density. Over the next year, patient will increase physical activity as tolerated with a goal of at least 150 minutes of moderate physical activity per week.  You can tell that you are exercising at a moderate intensity if your heart starts beating faster and you start breathing faster but can still hold a conversation. Moderate-intensity exercise ideas include: Walking 1 mile (1.6 km) in about 15 minutes Biking Hiking Golfing Dancing Water aerobics Patient will verbalize understanding of  everyday activities that increase physical activity by providing examples like the following: Yard work, such as: Insurance underwriter Gardening Washing windows or floors Patient will be able to explain general safety guidelines for exercising:  Before you start a new exercise program, talk with your health care provider. Do not exercise so much that you hurt yourself, feel dizzy, or get very short of breath. Wear comfortable clothes and wear shoes with good support. Drink plenty of water while you exercise to prevent dehydration or heat stroke. Work out until your breathing and your heartbeat get faster.        Depression Screen    10/23/2022    8:21 AM 10/20/2021    8:20 AM 10/19/2020    8:28 AM 08/05/2020    9:02 AM 10/23/2019   11:42 AM 10/19/2019    8:33 AM 10/16/2018    8:33 AM  PHQ 2/9 Scores  PHQ - 2 Score 0 0 0 0 0 0 0    Fall Risk    10/23/2022    8:20 AM 10/20/2021    8:18 AM 10/19/2020    8:48 AM 08/05/2020    9:00 AM 10/23/2019   11:42 AM  Fall Risk   Falls in the past year? 0 1 0 0 0  Number falls in past yr: 0 0 0    Injury with Fall? 0 0 0    Risk for fall due to : No Fall Risks Orthopedic patient;History of fall(s) No Fall Risks    Follow up Falls prevention discussed Falls prevention discussed Falls prevention discussed      MEDICARE RISK AT HOME:  Medicare Risk at Home - 10/23/22 0820     Any stairs in or around the home? Yes    If so, are there any without handrails? No    Home free of loose throw rugs in walkways, pet beds, electrical cords, etc? Yes    Adequate lighting in your home to reduce risk of falls? Yes    Life alert? No    Use of a  cane, walker or w/c? No    Grab bars in the bathroom? No    Shower chair or bench in shower? No    Elevated toilet seat or a handicapped toilet? No             TIMED UP AND GO:  Was the test performed?  No    Cognitive  Function:        10/23/2022    8:23 AM 10/20/2021    8:21 AM 10/19/2019    8:31 AM 10/16/2018    8:36 AM  6CIT Screen  What Year? 0 points 0 points 0 points 0 points  What month? 0 points 0 points 0 points 0 points  What time? 0 points 0 points 0 points 0 points  Count back from 20 0 points 0 points 0 points 0 points  Months in reverse 0 points 0 points 0 points 0 points  Repeat phrase 0 points 0 points 0 points 0 points  Total Score 0 points 0 points 0 points 0 points    Immunizations Immunization History  Administered Date(s) Administered   Fluad Quad(high Dose 65+) 03/18/2020   Meningococcal Conjugate 12/28/2015   Meningococcal polysaccharide vaccine (MPSV4) 12/27/2016   Moderna Sars-Covid-2 Vaccination 05/21/2019, 06/18/2019   Tdap 09/22/2013    TDAP status: Up to date  Flu Vaccine status: Declined, Education has been provided regarding the importance of this vaccine but patient still declined. Advised may receive this vaccine at local pharmacy or Health Dept. Aware to provide a copy of the vaccination record if obtained from local pharmacy or Health Dept. Verbalized acceptance and understanding.  Pneumococcal vaccine status: Due, Education has been provided regarding the importance of this vaccine. Advised may receive this vaccine at local pharmacy or Health Dept. Aware to provide a copy of the vaccination record if obtained from local pharmacy or Health Dept. Verbalized acceptance and understanding.  Covid-19 vaccine status: Declined, Education has been provided regarding the importance of this vaccine but patient still declined. Advised may receive this vaccine at local pharmacy or Health Dept.or vaccine clinic. Aware to provide a copy of the vaccination record if obtained from local pharmacy or Health Dept. Verbalized acceptance and understanding.  Qualifies for Shingles Vaccine? Yes   Zostavax completed No   Shingrix Completed?: No.    Education has been provided regarding  the importance of this vaccine. Patient has been advised to call insurance company to determine out of pocket expense if they have not yet received this vaccine. Advised may also receive vaccine at local pharmacy or Health Dept. Verbalized acceptance and understanding.  Screening Tests Health Maintenance  Topic Date Due   Zoster Vaccines- Shingrix (1 of 2) Never done   Pneumonia Vaccine 66+ Years old (1 of 1 - PCV) Never done   COVID-19 Vaccine (3 - Moderna risk series) 07/16/2019   Medicare Annual Wellness (AWV)  10/21/2022   INFLUENZA VACCINE  11/29/2022   DTaP/Tdap/Td (2 - Td or Tdap) 09/23/2023   HPV VACCINES  Aged Out    Health Maintenance  Health Maintenance Due  Topic Date Due   Zoster Vaccines- Shingrix (1 of 2) Never done   Pneumonia Vaccine 25+ Years old (1 of 1 - PCV) Never done   COVID-19 Vaccine (3 - Moderna risk series) 07/16/2019   Medicare Annual Wellness (AWV)  10/21/2022    Colorectal cancer screening: No longer required.   Lung Cancer Screening: (Low Dose CT Chest recommended if Age 42-80 years, 20 pack-year currently smoking OR  have quit w/in 15years.) does not qualify.   Lung Cancer Screening Referral: n/a  Additional Screening:  Hepatitis C Screening: does not qualify;   Vision Screening: Recommended annual ophthalmology exams for early detection of glaucoma and other disorders of the eye. Is the patient up to date with their annual eye exam?  Yes  Who is the provider or what is the name of the office in which the patient attends annual eye exams? Dr.Johnson  If pt is not established with a provider, would they like to be referred to a provider to establish care? No .   Dental Screening: Recommended annual dental exams for proper oral hygiene  Community Resource Referral / Chronic Care Management: CRR required this visit?  No   CCM required this visit?  No     Plan:     I have personally reviewed and noted the following in the patient's chart:    Medical and social history Use of alcohol, tobacco or illicit drugs  Current medications and supplements including opioid prescriptions. Patient is not currently taking opioid prescriptions. Functional ability and status Nutritional status Physical activity Advanced directives List of other physicians Hospitalizations, surgeries, and ER visits in previous 12 months Vitals Screenings to include cognitive, depression, and falls Referrals and appointments  In addition, I have reviewed and discussed with patient certain preventive protocols, quality metrics, and best practice recommendations. A written personalized care plan for preventive services as well as general preventive health recommendations were provided to patient.     Lorrene Reid, LPN   08/06/8117   After Visit Summary: (MyChart) Due to this being a telephonic visit, the after visit summary with patients personalized plan was offered to patient via MyChart   Nurse Notes: none

## 2022-10-23 NOTE — Patient Instructions (Signed)
Howard Ramos , Thank you for taking time to come for your Medicare Wellness Visit. I appreciate your ongoing commitment to your health goals. Please review the following plan we discussed and let me know if I can assist you in the future.   These are the goals we discussed:  Goals       AWV      10/20/2021 AWV Goal: Exercise for General Health  Patient will verbalize understanding of the benefits of increased physical activity: Exercising regularly is important. It will improve your overall fitness, flexibility, and endurance. Regular exercise also will improve your overall health. It can help you control your weight, reduce stress, and improve your bone density. Over the next year, patient will increase physical activity as tolerated with a goal of at least 150 minutes of moderate physical activity per week.  You can tell that you are exercising at a moderate intensity if your heart starts beating faster and you start breathing faster but can still hold a conversation. Moderate-intensity exercise ideas include: Walking 1 mile (1.6 km) in about 15 minutes Biking Hiking Golfing Dancing Water aerobics Patient will verbalize understanding of everyday activities that increase physical activity by providing examples like the following: Yard work, such as: Insurance underwriter Gardening Washing windows or floors Patient will be able to explain general safety guidelines for exercising:  Before you start a new exercise program, talk with your health care provider. Do not exercise so much that you hurt yourself, feel dizzy, or get very short of breath. Wear comfortable clothes and wear shoes with good support. Drink plenty of water while you exercise to prevent dehydration or heat stroke. Work out until your breathing and your heartbeat get faster.       Patient Stated (pt-stated)      Continue getting out and being  active        This is a list of the screening recommended for you and due dates:  Health Maintenance  Topic Date Due   Zoster (Shingles) Vaccine (1 of 2) Never done   Pneumonia Vaccine (1 of 1 - PCV) Never done   COVID-19 Vaccine (3 - Moderna risk series) 07/16/2019   Medicare Annual Wellness Visit  10/21/2022   Flu Shot  11/29/2022   DTaP/Tdap/Td vaccine (2 - Td or Tdap) 09/23/2023   HPV Vaccine  Aged Out    Advanced directives: Advance directive discussed with you today. I have provided a copy for you to complete at home and have notarized. Once this is complete please bring a copy in to our office so we can scan it into your chart.   Conditions/risks identified: Aim for 30 minutes of exercise or brisk walking, 6-8 glasses of water, and 5 servings of fruits and vegetables each day.   Next appointment: Follow up in one year for your annual wellness visit.  Preventive Care 57 Years and Older, Male  Preventive care refers to lifestyle choices and visits with your health care provider that can promote health and wellness. What does preventive care include? A yearly physical exam. This is also called an annual well check. Dental exams once or twice a year. Routine eye exams. Ask your health care provider how often you should have your eyes checked. Personal lifestyle choices, including: Daily care of your teeth and gums. Regular physical activity. Eating a healthy diet. Avoiding tobacco and drug use. Limiting alcohol use. Practicing safe sex.  Taking low doses of aspirin every day. Taking vitamin and mineral supplements as recommended by your health care provider. What happens during an annual well check? The services and screenings done by your health care provider during your annual well check will depend on your age, overall health, lifestyle risk factors, and family history of disease. Counseling  Your health care provider may ask you questions about your: Alcohol  use. Tobacco use. Drug use. Emotional well-being. Home and relationship well-being. Sexual activity. Eating habits. History of falls. Memory and ability to understand (cognition). Work and work Astronomer. Screening  You may have the following tests or measurements: Height, weight, and BMI. Blood pressure. Lipid and cholesterol levels. These may be checked every 5 years, or more frequently if you are over 82 years old. Skin check. Lung cancer screening. You may have this screening every year starting at age 82 if you have a 30-pack-year history of smoking and currently smoke or have quit within the past 15 years. Fecal occult blood test (FOBT) of the stool. You may have this test every year starting at age 30. Flexible sigmoidoscopy or colonoscopy. You may have a sigmoidoscopy every 5 years or a colonoscopy every 10 years starting at age 82. Prostate cancer screening. Recommendations will vary depending on your family history and other risks. Hepatitis C blood test. Hepatitis B blood test. Sexually transmitted disease (STD) testing. Diabetes screening. This is done by checking your blood sugar (glucose) after you have not eaten for a while (fasting). You may have this done every 1-3 years. Abdominal aortic aneurysm (AAA) screening. You may need this if you are a current or former smoker. Osteoporosis. You may be screened starting at age 82 if you are at high risk. Talk with your health care provider about your test results, treatment options, and if necessary, the need for more tests. Vaccines  Your health care provider may recommend certain vaccines, such as: Influenza vaccine. This is recommended every year. Tetanus, diphtheria, and acellular pertussis (Tdap, Td) vaccine. You may need a Td booster every 10 years. Zoster vaccine. You may need this after age 82. Pneumococcal 13-valent conjugate (PCV13) vaccine. One dose is recommended after age 82. Pneumococcal polysaccharide  (PPSV23) vaccine. One dose is recommended after age 82. Talk to your health care provider about which screenings and vaccines you need and how often you need them. This information is not intended to replace advice given to you by your health care provider. Make sure you discuss any questions you have with your health care provider. Document Released: 05/13/2015 Document Revised: 01/04/2016 Document Reviewed: 02/15/2015 Elsevier Interactive Patient Education  2017 [employer].  Fall Prevention in the Home Falls can cause injuries. They can happen to people of all ages. There are many things you can do to make your home safe and to help prevent falls. What can I do on the outside of my home? Regularly fix the edges of walkways and driveways and fix any cracks. Remove anything that might make you trip as you walk through a door, such as a raised step or threshold. Trim any bushes or trees on the path to your home. Use bright outdoor lighting. Clear any walking paths of anything that might make someone trip, such as rocks or tools. Regularly check to see if handrails are loose or broken. Make sure that both sides of any steps have handrails. Any raised decks and porches should have guardrails on the edges. Have any leaves, snow, or ice cleared regularly. Use  sand or salt on walking paths during winter. Clean up any spills in your garage right away. This includes oil or grease spills. What can I do in the bathroom? Use night lights. Install grab bars by the toilet and in the tub and shower. Do not use towel bars as grab bars. Use non-skid mats or decals in the tub or shower. If you need to sit down in the shower, use a plastic, non-slip stool. Keep the floor dry. Clean up any water that spills on the floor as soon as it happens. Remove soap buildup in the tub or shower regularly. Attach bath mats securely with double-sided non-slip rug tape. Do not have throw rugs and other things on the  floor that can make you trip. What can I do in the bedroom? Use night lights. Make sure that you have a light by your bed that is easy to reach. Do not use any sheets or blankets that are too big for your bed. They should not hang down onto the floor. Have a firm chair that has side arms. You can use this for support while you get dressed. Do not have throw rugs and other things on the floor that can make you trip. What can I do in the kitchen? Clean up any spills right away. Avoid walking on wet floors. Keep items that you use a lot in easy-to-reach places. If you need to reach something above you, use a strong step stool that has a grab bar. Keep electrical cords out of the way. Do not use floor polish or wax that makes floors slippery. If you must use wax, use non-skid floor wax. Do not have throw rugs and other things on the floor that can make you trip. What can I do with my stairs? Do not leave any items on the stairs. Make sure that there are handrails on both sides of the stairs and use them. Fix handrails that are broken or loose. Make sure that handrails are as long as the stairways. Check any carpeting to make sure that it is firmly attached to the stairs. Fix any carpet that is loose or worn. Avoid having throw rugs at the top or bottom of the stairs. If you do have throw rugs, attach them to the floor with carpet tape. Make sure that you have a light switch at the top of the stairs and the bottom of the stairs. If you do not have them, ask someone to add them for you. What else can I do to help prevent falls? Wear shoes that: Do not have high heels. Have rubber bottoms. Are comfortable and fit you well. Are closed at the toe. Do not wear sandals. If you use a stepladder: Make sure that it is fully opened. Do not climb a closed stepladder. Make sure that both sides of the stepladder are locked into place. Ask someone to hold it for you, if possible. Clearly mark and make  sure that you can see: Any grab bars or handrails. First and last steps. Where the edge of each step is. Use tools that help you move around (mobility aids) if they are needed. These include: Canes. Walkers. Scooters. Crutches. Turn on the lights when you go into a dark area. Replace any light bulbs as soon as they burn out. Set up your furniture so you have a clear path. Avoid moving your furniture around. If any of your floors are uneven, fix them. If there are any pets around you, be  aware of where they are. Review your medicines with your doctor. Some medicines can make you feel dizzy. This can increase your chance of falling. Ask your doctor what other things that you can do to help prevent falls. This information is not intended to replace advice given to you by your health care provider. Make sure you discuss any questions you have with your health care provider. Document Released: 02/10/2009 Document Revised: 09/22/2015 Document Reviewed: 05/21/2014 Elsevier Interactive Patient Education  2017 [employer].

## 2022-10-26 ENCOUNTER — Other Ambulatory Visit: Payer: Self-pay | Admitting: Urology

## 2022-10-26 DIAGNOSIS — C61 Malignant neoplasm of prostate: Secondary | ICD-10-CM

## 2022-10-29 ENCOUNTER — Telehealth: Payer: Self-pay

## 2022-10-29 NOTE — Telephone Encounter (Signed)
-----   Message from Ferdinand Lango, RN sent at 10/26/2022 11:01 AM EDT ----- Fusion Biopsy results

## 2022-10-29 NOTE — Telephone Encounter (Signed)
Routed to Dr. Retta Diones

## 2023-01-15 ENCOUNTER — Other Ambulatory Visit: Payer: Medicare Other

## 2023-01-22 ENCOUNTER — Ambulatory Visit: Payer: Medicare Other | Admitting: Urology

## 2023-02-13 DIAGNOSIS — B078 Other viral warts: Secondary | ICD-10-CM | POA: Diagnosis not present

## 2023-02-13 DIAGNOSIS — X32XXXD Exposure to sunlight, subsequent encounter: Secondary | ICD-10-CM | POA: Diagnosis not present

## 2023-02-13 DIAGNOSIS — L570 Actinic keratosis: Secondary | ICD-10-CM | POA: Diagnosis not present

## 2023-05-07 NOTE — Progress Notes (Signed)
 History of Present Illness:   here today for follow-up of prostate cancer, for continuing active surveillance.     TRUS/Bx on 5.8.2018. At that time, PSA was 5.7, prostatic volume 42 mL, PSA density 0.14. 1/12 cores positive for adenocarcinoma, GS 3+3 pattern. The positive core was at the right base lateral, 10% was involved with cancer.    He decided on active surveillance.    1.14.2020: Most recent PSA 7.2 (1.6.2020). He has had no recent urologic issues. His voiding is still excellent.    6.30.2020: Prostate MRI--volume 35.35 ml. No evidence of trans capsular/seminal vesicle/neurovascular bundle involvement, no evidence of pelvic adenopathy or bony metastatic disease. 2 ROIs--  #1--PIRADS 4 lesion Rt posterolateral peropheral zone 18x11x14 ml.  #2--PIRADS 3 lesion Lt apical posterolateral peripheral zone 8x5x5 ml.    8.17.2020: Fusion TRUS/Bx. Prostate volume 30 ml. 1/12 systematic cores + for PCA--GS 3+4 in 20% of core from Rt apex medial. 6 cores from 2 ROI's all benign.    2.9.2023: MRI of prostate   7 mm peripheral zone nodule in the left posterolateral mid gland shows no significant change, but remains suspicious for high-grade carcinoma. PI-RADS 4 (v2.1): High (clinically significant cancer likely) No evidence of extracapsular extension or pelvic metastatic disease.   3.28.2023: PSA 14.5.  He is seeing the results of both his PSA and MRI.  At this point, he would like to hold off on repeat biopsy.     9.26.2023: PSA 16.5.  3.26.2024: PSA 20.2  4.24.2024: MRI prostate-- FINDINGS: Prostate:   Hazy low T2 signal in the peripheral zone is nonfocal, likely postinflammatory, and is considered PI-RADS category 2.   Region of interest # 1: PI-RADS category 3 lesion of the left posterolateral peripheral zone the apex with focally reduced T2 signal (image 34 of series 11) corresponding to faint reduced ADC map activity (image 9, series 9). No discernible early  enhancement or restriction of diffusion at this time. Region of interest # 2: PI-RADS category 3 lesion of the right anterior transition zone in the mid gland and apex with heterogeneous T2 signal (image 35, series 11) corresponding to reduced ADC map activity and restricted diffusion (image 12 of series 9 and 10). This measures 0.69 cc (1.3 by 1.1 by 0.8 cm). Region of interest # 3: PI-RADS category 3 lesion of the right posterolateral peripheral zone in the mid gland and base, with focally reduced T2 signal (image 26, series 11) corresponding to reduced ADC map activity (image 9, series 9). This measures 0.28 cc (1.5 by 0.5 by 0.4 cm). Volume: 3D volumetric analysis: Prostate volume 34.57 cc (5.3 by 4.0 by 3.4 cm) Transcapsular spread:  Absent Seminal vesicle involvement: Absent Neurovascular bundle involvement: Absent Pelvic adenopathy: Absent Bone metastasis: Absent Other findings: No other significant findings.   IMPRESSION: 1. Three PI-RADS category 3 lesions are present in the peripheral zone and transition zone.  6.26.2024: Fusion Bx--Volume 34 mL.  All 12 systematic needle core biopsies were negative for adenocarcinoma.  3 needle core biopsies were taken from each of the 3 regions of interest.  Only 1 region of interest (ROI 3) had 1/3 cores revealing GS 3+3 pattern and 20% of that core.  1.14.2025: Recent PSA 19.6 (10 months ago, 20.2) this is a routine visit for him.  He has had no lower urinary tract symptoms.   Past Medical History:  Diagnosis Date   Cancer [facility])    Prostate   Diabetes mellitus without complication (HCC)    elevated hgb  A1C- diet controlled   Hyperlipidemia    Prostate cancer [facility])     Past Surgical History:  Procedure Laterality Date   CATARACT EXTRACTION W/PHACO Left 10/29/2013   Procedure: CATARACT EXTRACTION PHACO AND INTRAOCULAR LENS PLACEMENT (IOC);  Surgeon: Cherene Mania, MD;  Location: [facility];  Service: Ophthalmology;  Laterality: Left;   CDE:  15.52   CATARACT EXTRACTION W/PHACO Right 12/07/2013   Procedure: CATARACT EXTRACTION PHACO AND INTRAOCULAR LENS PLACEMENT (IOC);  Surgeon: Cherene Mania, MD;  Location: [facility];  Service: Ophthalmology;  Laterality: Right;  CDE 16.99   ROTATOR CUFF REPAIR Right     Home Medications:  Allergies as of 05/14/2023       Reactions   Statins Other (See Comments)   Body aches        Medication List        Accurate as of May 07, 2023 12:50 PM. If you have any questions, ask your nurse or doctor.          aspirin 81 MG chewable tablet Chew by mouth daily.   pravastatin  10 MG tablet Commonly known as: PRAVACHOL  Take 10 mg by mouth daily.        Allergies:  Allergies  Allergen Reactions   Statins Other (See Comments)    Body aches    Family History  Problem Relation Age of Onset   Hypertension Mother    Cancer Father        Pancreatic    Cancer Sister        Lymphoma    Social History:  reports that he quit smoking about 15 years ago. His smoking use included cigarettes. He started smoking about 55 years ago. He has a 40 pack-year smoking history. He has never used smokeless tobacco. He reports that he does not drink alcohol and does not use drugs.  ROS: A complete review of systems was performed.  All systems are negative except for pertinent findings as noted.  Physical Exam:  Vital signs in last 24 hours: There were no vitals taken for this visit. Constitutional:  Alert and oriented, No acute distress Cardiovascular: Regular rate  Respiratory: Normal respiratory effort Neurologic: Grossly intact, no focal deficits Psychiatric: Normal mood and affect  I have reviewed prior pt notes  I have reviewed urinalysis results  I have independently reviewed prior imaging--MRI readings, ultrasound readings  I have reviewed prior PSA and pathology results  He was given a copy of his most recent pathology results     Impression/Assessment:  Grade group  2, low-volume prostate cancer, on active surveillance with fairly high PSA density, on active surveillance.  His most recent fusion biopsy from last summer revealed very small volume grade group 1 prostate cancer  Plan:  I will see back in 6 months after PSA  Unless significant change of his PSA down the road, I do not anticipate the need for repeat biopsies

## 2023-05-08 ENCOUNTER — Other Ambulatory Visit: Payer: Medicare Other

## 2023-05-08 DIAGNOSIS — C61 Malignant neoplasm of prostate: Secondary | ICD-10-CM

## 2023-05-09 LAB — PSA: Prostate Specific Ag, Serum: 19.6 ng/mL — ABNORMAL HIGH (ref 0.0–4.0)

## 2023-05-14 ENCOUNTER — Encounter: Payer: Self-pay | Admitting: Urology

## 2023-05-14 ENCOUNTER — Ambulatory Visit: Payer: Medicare Other | Admitting: Urology

## 2023-05-14 VITALS — BP 138/76 | HR 64

## 2023-05-14 DIAGNOSIS — C61 Malignant neoplasm of prostate: Secondary | ICD-10-CM | POA: Diagnosis not present

## 2023-05-14 LAB — URINALYSIS, ROUTINE W REFLEX MICROSCOPIC
Bilirubin, UA: NEGATIVE
Glucose, UA: NEGATIVE
Ketones, UA: NEGATIVE
Leukocytes,UA: NEGATIVE
Nitrite, UA: NEGATIVE
Protein,UA: NEGATIVE
RBC, UA: NEGATIVE
Specific Gravity, UA: 1.015 (ref 1.005–1.030)
Urobilinogen, Ur: 2 mg/dL — ABNORMAL HIGH (ref 0.2–1.0)
pH, UA: 7 (ref 5.0–7.5)

## 2023-08-14 DIAGNOSIS — L570 Actinic keratosis: Secondary | ICD-10-CM | POA: Diagnosis not present

## 2023-08-14 DIAGNOSIS — X32XXXD Exposure to sunlight, subsequent encounter: Secondary | ICD-10-CM | POA: Diagnosis not present

## 2023-11-11 NOTE — Progress Notes (Signed)
 Impression/Assessment:  Grade group 2, low-volume prostate cancer, on active surveillance with fairly high PSA density, on active surveillance.  His most recent fusion biopsy from last summer revealed very small volume grade group 1 prostate cancer.  PSA not proportionate to volume/grade of cancer  Plan:  I will check PSA today  Continue semiannual follow-up  History of Present Illness:   here today for follow-up of prostate cancer, for continuing active surveillance.     TRUS/Bx on 5.8.2018. At that time, PSA was 5.7, prostatic volume 42 mL, PSA density 0.14. 1/12 cores positive for adenocarcinoma, GS 3+3 pattern. The positive core was at the right base lateral, 10% was involved with cancer.    He decided on active surveillance.    1.14.2020: Most recent PSA 7.2 (1.6.2020). He has had no recent urologic issues. His voiding is still excellent.    6.30.2020: Prostate MRI--volume 35.35 ml. No evidence of trans capsular/seminal vesicle/neurovascular bundle involvement, no evidence of pelvic adenopathy or bony metastatic disease. 2 ROIs--  #1--PIRADS 4 lesion Rt posterolateral peropheral zone 18x11x14 ml.  #2--PIRADS 3 lesion Lt apical posterolateral peripheral zone 8x5x5 ml.    8.17.2020: Fusion TRUS/Bx. Prostate volume 30 ml. 1/12 systematic cores + for PCA--GS 3+4 in 20% of core from Rt apex medial. 6 cores from 2 ROI's all benign.    2.9.2023: MRI of prostate   7 mm peripheral zone nodule in the left posterolateral mid gland shows no significant change, but remains suspicious for high-grade carcinoma. PI-RADS 4 (v2.1): High (clinically significant cancer likely) No evidence of extracapsular extension or pelvic metastatic disease.   3.28.2023: PSA 14.5.  He is seeing the results of both his PSA and MRI.  At this point, he would like to hold off on repeat biopsy.     9.26.2023: PSA 16.5.  3.26.2024: PSA 20.2  4.24.2024: MRI prostate-- FINDINGS: Prostate:   Hazy low T2  signal in the peripheral zone is nonfocal, likely postinflammatory, and is considered PI-RADS category 2.   Region of interest # 1: PI-RADS category 3 lesion of the left posterolateral peripheral zone the apex with focally reduced T2 signal (image 34 of series 11) corresponding to faint reduced ADC map activity (image 9, series 9). No discernible early enhancement or restriction of diffusion at this time. Region of interest # 2: PI-RADS category 3 lesion of the right anterior transition zone in the mid gland and apex with heterogeneous T2 signal (image 35, series 11) corresponding to reduced ADC map activity and restricted diffusion (image 12 of series 9 and 10). This measures 0.69 cc (1.3 by 1.1 by 0.8 cm). Region of interest # 3: PI-RADS category 3 lesion of the right posterolateral peripheral zone in the mid gland and base, with focally reduced T2 signal (image 26, series 11) corresponding to reduced ADC map activity (image 9, series 9). This measures 0.28 cc (1.5 by 0.5 by 0.4 cm). Volume: 3D volumetric analysis: Prostate volume 34.57 cc (5.3 by 4.0 by 3.4 cm) Transcapsular spread:  Absent Seminal vesicle involvement: Absent Neurovascular bundle involvement: Absent Pelvic adenopathy: Absent Bone metastasis: Absent Other findings: No other significant findings.   IMPRESSION: 1. Three PI-RADS category 3 lesions are present in the peripheral zone and transition zone.  6.26.2024: Fusion Bx--Volume 34 mL.  All 12 systematic needle core biopsies were negative for adenocarcinoma.  3 needle core biopsies were taken from each of the 3 regions of interest.  Only 1 region of interest (ROI 3) had 1/3 cores revealing GS 3+3  pattern and 20% of that core.  1.14.2025: Recent PSA 19.6 (10 months ago, 20.2) this is a routine visit for him.  He has had no lower urinary tract symptoms.  7.15.2025: Sheree is here for routine check.  He has not had recent PSA checked.   no significant LUTS.  IPSS  5/1. Past Medical History:  Diagnosis Date   Cancer [facility])    Prostate   Diabetes mellitus without complication (HCC)    elevated hgb A1C- diet controlled   Hyperlipidemia    Prostate cancer [facility])     Past Surgical History:  Procedure Laterality Date   CATARACT EXTRACTION W/PHACO Left 10/29/2013   Procedure: CATARACT EXTRACTION PHACO AND INTRAOCULAR LENS PLACEMENT (IOC);  Surgeon: Cherene Mania, MD;  Location: [facility];  Service: Ophthalmology;  Laterality: Left;  CDE:  15.52   CATARACT EXTRACTION W/PHACO Right 12/07/2013   Procedure: CATARACT EXTRACTION PHACO AND INTRAOCULAR LENS PLACEMENT (IOC);  Surgeon: Cherene Mania, MD;  Location: [facility];  Service: Ophthalmology;  Laterality: Right;  CDE 16.99   ROTATOR CUFF REPAIR Right     Home Medications:  Allergies as of 11/12/2023       Reactions   Statins Other (See Comments)   Body aches        Medication List        Accurate as of November 11, 2023  9:48 AM. If you have any questions, ask your nurse or doctor.          aspirin 81 MG chewable tablet Chew by mouth daily.   pravastatin  10 MG tablet Commonly known as: PRAVACHOL  Take 10 mg by mouth daily.        Allergies:  Allergies  Allergen Reactions   Statins Other (See Comments)    Body aches    Family History  Problem Relation Age of Onset   Hypertension Mother    Cancer Father        Pancreatic    Cancer Sister        Lymphoma    Social History:  reports that he quit smoking about 16 years ago. His smoking use included cigarettes. He started smoking about 56 years ago. He has a 40 pack-year smoking history. He has never used smokeless tobacco. He reports that he does not drink alcohol and does not use drugs.  ROS: A complete review of systems was performed.  All systems are negative except for pertinent findings as noted.  Physical Exam:  Vital signs in last 24 hours: There were no vitals taken for this visit. Constitutional:  Alert and oriented, No acute  distress Cardiovascular: Regular rate  Respiratory: Normal respiratory effort GU: Normal anal sphincter tone.  Prostate 40 g, symmetric, nonnodular, nontender. Neurologic: Grossly intact, no focal deficits Psychiatric: Normal mood and affect  I have reviewed prior pt notes  I have reviewed urinalysis results  I have independently reviewed prior imaging--MRI readings, ultrasound readings  I have reviewed prior PSA and pathology results

## 2023-11-12 ENCOUNTER — Ambulatory Visit: Payer: Medicare Other | Admitting: Urology

## 2023-11-12 VITALS — BP 123/68 | HR 61

## 2023-11-12 DIAGNOSIS — C61 Malignant neoplasm of prostate: Secondary | ICD-10-CM

## 2023-11-12 DIAGNOSIS — N400 Enlarged prostate without lower urinary tract symptoms: Secondary | ICD-10-CM

## 2023-11-12 LAB — URINALYSIS, ROUTINE W REFLEX MICROSCOPIC
Bilirubin, UA: NEGATIVE
Glucose, UA: NEGATIVE
Ketones, UA: NEGATIVE
Leukocytes,UA: NEGATIVE
Nitrite, UA: NEGATIVE
Protein,UA: NEGATIVE
RBC, UA: NEGATIVE
Specific Gravity, UA: 1.025 (ref 1.005–1.030)
Urobilinogen, Ur: 1 mg/dL (ref 0.2–1.0)
pH, UA: 6 (ref 5.0–7.5)

## 2023-11-13 LAB — PSA: Prostate Specific Ag, Serum: 21.9 ng/mL — ABNORMAL HIGH (ref 0.0–4.0)

## 2023-12-24 ENCOUNTER — Encounter: Payer: Self-pay | Admitting: Family

## 2023-12-24 ENCOUNTER — Ambulatory Visit (INDEPENDENT_AMBULATORY_CARE_PROVIDER_SITE_OTHER): Admitting: Family

## 2023-12-24 VITALS — BP 145/67 | HR 59 | Temp 97.9°F | Resp 18 | Ht 70.0 in | Wt 202.8 lb

## 2023-12-24 DIAGNOSIS — Z0000 Encounter for general adult medical examination without abnormal findings: Secondary | ICD-10-CM | POA: Diagnosis not present

## 2023-12-24 DIAGNOSIS — E663 Overweight: Secondary | ICD-10-CM

## 2023-12-24 DIAGNOSIS — Z0001 Encounter for general adult medical examination with abnormal findings: Secondary | ICD-10-CM

## 2023-12-24 DIAGNOSIS — C61 Malignant neoplasm of prostate: Secondary | ICD-10-CM

## 2023-12-24 DIAGNOSIS — E785 Hyperlipidemia, unspecified: Secondary | ICD-10-CM | POA: Diagnosis not present

## 2023-12-24 LAB — CMP14+EGFR
ALT: 15 IU/L (ref 0–44)
AST: 14 IU/L (ref 0–40)
Albumin: 4.1 g/dL (ref 3.7–4.7)
Alkaline Phosphatase: 71 IU/L (ref 44–121)
BUN/Creatinine Ratio: 14 (ref 10–24)
BUN: 15 mg/dL (ref 8–27)
Bilirubin Total: 0.6 mg/dL (ref 0.0–1.2)
CO2: 25 mmol/L (ref 20–29)
Calcium: 10 mg/dL (ref 8.6–10.2)
Chloride: 101 mmol/L (ref 96–106)
Creatinine, Ser: 1.09 mg/dL (ref 0.76–1.27)
Globulin, Total: 2.3 g/dL (ref 1.5–4.5)
Glucose: 74 mg/dL (ref 70–99)
Potassium: 5.6 mmol/L — ABNORMAL HIGH (ref 3.5–5.2)
Sodium: 140 mmol/L (ref 134–144)
Total Protein: 6.4 g/dL (ref 6.0–8.5)
eGFR: 68 mL/min/1.73 (ref >59)

## 2023-12-24 LAB — CBC WITH DIFFERENTIAL/PLATELET
Basophils Absolute: 0.1 x10E3/uL (ref 0.0–0.2)
Basos: 1 %
EOS (ABSOLUTE): 0.3 x10E3/uL (ref 0.0–0.4)
Eos: 4 %
Hematocrit: 49.7 % (ref 37.5–51.0)
Hemoglobin: 15.9 g/dL (ref 13.0–17.7)
Immature Grans (Abs): 0 x10E3/uL (ref 0.0–0.1)
Immature Granulocytes: 0 %
Lymphocytes Absolute: 2.2 x10E3/uL (ref 0.7–3.1)
Lymphs: 30 %
MCH: 30.6 pg (ref 26.6–33.0)
MCHC: 32 g/dL (ref 31.5–35.7)
MCV: 96 fL (ref 79–97)
Monocytes Absolute: 0.7 x10E3/uL (ref 0.1–0.9)
Monocytes: 10 %
Neutrophils Absolute: 4 x10E3/uL (ref 1.4–7.0)
Neutrophils: 55 %
Platelets: 214 x10E3/uL (ref 150–450)
RBC: 5.2 x10E6/uL (ref 4.14–5.80)
RDW: 12 % (ref 11.6–15.4)
WBC: 7.3 x10E3/uL (ref 3.4–10.8)

## 2023-12-24 LAB — LIPID PANEL
Chol/HDL Ratio: 5.2 ratio — ABNORMAL HIGH (ref 0.0–5.0)
Cholesterol, Total: 233 mg/dL — ABNORMAL HIGH (ref 100–199)
HDL: 45 mg/dL (ref >39)
LDL Chol Calc (NIH): 156 mg/dL — ABNORMAL HIGH (ref 0–99)
Triglycerides: 178 mg/dL — ABNORMAL HIGH (ref 0–149)
VLDL Cholesterol Cal: 32 mg/dL (ref 5–40)

## 2023-12-24 NOTE — Progress Notes (Signed)
 Subjective:    Patient ID: Howard Ramos, male    DOB: [birth year], 83 y.o.   MRN: [id number]  Chief Complaint  Patient presents with   Annual Exam     HPI Pt presents to the office today for CPE.   He is followed by Urologists every 6 months for prostate cancer.   [facility]   He is currently not taking any medications. Pt denies any headache, palpitations, SOB, or edema at this time.     Review of Systems  All other systems reviewed and are negative.   Social History   Socioeconomic History   Marital status: Married    Spouse name: Naomie   Number of children: 3   Years of education: 13   Highest education level: Some college, no degree  Occupational History   Occupation: retired    Comment: part-time as Curator, painting  Tobacco Use   Smoking status: Former    Current packs/day: 0.00    Average packs/day: 1 pack/day for 40.0 years (40.0 ttl pk-yrs)    Types: Cigarettes    Start date: 10/24/1967    Quit date: 10/24/2007    Years since quitting: 16.1   Smokeless tobacco: Never   Tobacco comments:    He has annual low dose chest CT at [facility] in [place]  Vaping Use   Vaping status: Never Used  Substance and Sexual Activity   Alcohol use: No   Drug use: No   Sexual activity: Not Currently    Birth control/protection: None  Other Topics Concern   Not on file  Social History Narrative   He owns 23 acres in [place] - orchard, garden, farmland, that he maintains.    Stays very active outdoors, but no specific exercise.    Married for over 50 years.    Goes to [facility] in [place] for vaccines and screenings.   Social Drivers of Corporate investment banker Strain: Low Risk  (10/23/2022)   Overall Financial Resource Strain (CARDIA)    Difficulty of Paying Living Expenses: Not hard at all  Food Insecurity: No Food Insecurity (10/23/2022)   Hunger Vital Sign    Worried About Running Out of Food in the Last Year: Never true    Ran Out of Food  in the Last Year: Never true  Transportation Needs: No Transportation Needs (10/23/2022)   PRAPARE - Administrator, Civil Service (Medical): No    Lack of Transportation (Non-Medical): No  Physical Activity: Sufficiently Active (10/23/2022)   Exercise Vital Sign    Days of Exercise per Week: 5 days    Minutes of Exercise per Session: 30 min  Stress: No Stress Concern Present (10/23/2022)   [employer] of Occupational Health - Occupational Stress Questionnaire    Feeling of Stress : Not at all  Social Connections: Socially Integrated (10/23/2022)   Social Connection and Isolation Panel    Frequency of Communication with Friends and Family: More than three times a week    Frequency of Social Gatherings with Friends and Family: More than three times a week    Attends Religious Services: More than 4 times per year    Active Member of [employer] or Organizations: Yes    Attends Engineer, structural: More than 4 times per year    Marital Status: Married   Family History  Problem Relation Age of Onset   Hypertension Mother    Cancer Father  Pancreatic    Cancer Sister        Lymphoma        Objective:   Physical Exam Vitals reviewed.  Constitutional:      General: He is not in acute distress.    Appearance: He is well-developed.  HENT:     Head: Normocephalic.     Right Ear: Tympanic membrane normal.     Left Ear: Tympanic membrane normal.  Eyes:     General:        Right eye: No discharge.        Left eye: No discharge.     Pupils: Pupils are equal, round, and reactive to light.  Neck:     Thyroid : No thyromegaly.  Cardiovascular:     Rate and Rhythm: Normal rate and regular rhythm.     Heart sounds: Normal heart sounds. No murmur heard. Pulmonary:     Effort: Pulmonary effort is normal. No respiratory distress.     Breath sounds: Normal breath sounds. No wheezing.  Abdominal:     General: Bowel sounds are normal. There is no distension.      Palpations: Abdomen is soft.     Tenderness: There is no abdominal tenderness.  Musculoskeletal:        General: No tenderness. Normal range of motion.     Cervical back: Normal range of motion and neck supple.  Skin:    General: Skin is warm and dry.     Findings: No erythema or rash.  Neurological:     Mental Status: He is alert and oriented to person, place, and time.     Cranial Nerves: No cranial nerve deficit.     Deep Tendon Reflexes: Reflexes are normal and symmetric.  Psychiatric:        Behavior: Behavior normal.        Thought Content: Thought content normal.        Judgment: Judgment normal.       BP (!) 145/67   Pulse (!) 59   Temp 97.9 F (36.6 C) (Temporal)   Resp 18   Ht 5' 10 (1.778 m)   Wt 202 lb 12.8 oz (92 kg)   BMI 29.10 kg/m      Assessment & Plan:  [name] comes in today with chief complaint of Annual Exam   Diagnosis and orders addressed:  1. Annual physical exam (Primary) - CMP14+EGFR - CBC with Differential/Platelet - Lipid panel  2. Overweight (BMI 25.0-29.9) - CMP14+EGFR - CBC with Differential/Platelet  3. Prostate cancer (HCC) - CMP14+EGFR - CBC with Differential/Platelet  4. Hyperlipidemia, unspecified hyperlipidemia type - CMP14+EGFR - CBC with Differential/Platelet - Lipid panel   Labs pending Continue current medications  [facility] Maintenance reviewed Diet and exercise encouraged  Return in about 1 year (around 12/23/2024), or if symptoms worsen or fail to improve.    Bari Learn, FNP

## 2023-12-24 NOTE — Patient Instructions (Signed)
 Health Maintenance After Age 83 After age 27, you are at a higher risk for certain long-term diseases and infections as well as injuries from falls. Falls are a major cause of broken bones and head injuries in people who are older than age 73. Getting regular preventive care can help to keep you healthy and well. Preventive care includes getting regular testing and making lifestyle changes as recommended by your health care provider. Talk with your health care provider about: Which screenings and tests you should have. A screening is a test that checks for a disease when you have no symptoms. A diet and exercise plan that is right for you. What should I know about screenings and tests to prevent falls? Screening and testing are the best ways to find a health problem early. Early diagnosis and treatment give you the best chance of managing medical conditions that are common after age 90. Certain conditions and lifestyle choices may make you more likely to have a fall. Your health care provider may recommend: Regular vision checks. Poor vision and conditions such as cataracts can make you more likely to have a fall. If you wear glasses, make sure to get your prescription updated if your vision changes. Medicine review. Work with your health care provider to regularly review all of the medicines you are taking, including over-the-counter medicines. Ask your health care provider about any side effects that may make you more likely to have a fall. Tell your health care provider if any medicines that you take make you feel dizzy or sleepy. Strength and balance checks. Your health care provider may recommend certain tests to check your strength and balance while standing, walking, or changing positions. Foot health exam. Foot pain and numbness, as well as not wearing proper footwear, can make you more likely to have a fall. Screenings, including: Osteoporosis screening. Osteoporosis is a condition that causes  the bones to get weaker and break more easily. Blood pressure screening. Blood pressure changes and medicines to control blood pressure can make you feel dizzy. Depression screening. You may be more likely to have a fall if you have a fear of falling, feel depressed, or feel unable to do activities that you used to do. Alcohol  use screening. Using too much alcohol  can affect your balance and may make you more likely to have a fall. Follow these instructions at home: Lifestyle Do not drink alcohol  if: Your health care provider tells you not to drink. If you drink alcohol : Limit how much you have to: 0-1 drink a day for women. 0-2 drinks a day for men. Know how much alcohol  is in your drink. In the [place] one drink equals one 12 oz bottle of beer (355 mL), one 5 oz glass of wine (148 mL), or one 1 oz glass of hard liquor (44 mL). Do not use any products that contain nicotine or tobacco. These products include cigarettes, chewing tobacco, and vaping devices, such as e-cigarettes. If you need help quitting, ask your health care provider. Activity  Follow a regular exercise program to stay fit. This will help you maintain your balance. Ask your health care provider what types of exercise are appropriate for you. If you need a cane or walker, use it as recommended by your health care provider. Wear supportive shoes that have nonskid soles. Safety  Remove any tripping hazards, such as rugs, cords, and clutter. Install safety equipment such as grab bars in bathrooms and safety rails on stairs. Keep rooms and walkways  well-lit. General instructions Talk with your health care provider about your risks for falling. Tell your health care provider if: You fall. Be sure to tell your health care provider about all falls, even ones that seem minor. You feel dizzy, tiredness (fatigue), or off-balance. Take over-the-counter and prescription medicines only as told by your health care provider. These include  supplements. Eat a healthy diet and maintain a healthy weight. A healthy diet includes low-fat dairy products, low-fat (lean) meats, and fiber from whole grains, beans, and lots of fruits and vegetables. Stay current with your vaccines. Schedule regular health, dental, and eye exams. Summary Having a healthy lifestyle and getting preventive care can help to protect your health and wellness after age 15. Screening and testing are the best way to find a health problem early and help you avoid having a fall. Early diagnosis and treatment give you the best chance for managing medical conditions that are more common for people who are older than age 42. Falls are a major cause of broken bones and head injuries in people who are older than age 64. Take precautions to prevent a fall at home. Work with your health care provider to learn what changes you can make to improve your health and wellness and to prevent falls. This information is not intended to replace advice given to you by your health care provider. Make sure you discuss any questions you have with your health care provider. Document Revised: 09/05/2020 Document Reviewed: 09/05/2020 Elsevier Patient Education  2024 [employer].

## 2023-12-26 ENCOUNTER — Ambulatory Visit: Payer: Self-pay | Admitting: Family

## 2023-12-26 ENCOUNTER — Other Ambulatory Visit: Payer: Self-pay | Admitting: Family Medicine

## 2023-12-26 DIAGNOSIS — E875 Hyperkalemia: Secondary | ICD-10-CM

## 2023-12-27 ENCOUNTER — Other Ambulatory Visit

## 2023-12-27 DIAGNOSIS — E875 Hyperkalemia: Secondary | ICD-10-CM

## 2023-12-27 LAB — BASIC METABOLIC PANEL WITH GFR
BUN/Creatinine Ratio: 16 (ref 10–24)
BUN: 16 mg/dL (ref 8–27)
CO2: 25 mmol/L (ref 20–29)
Calcium: 9.8 mg/dL (ref 8.6–10.2)
Chloride: 102 mmol/L (ref 96–106)
Creatinine, Ser: 1.03 mg/dL (ref 0.76–1.27)
Glucose: 87 mg/dL (ref 70–99)
Potassium: 4.7 mmol/L (ref 3.5–5.2)
Sodium: 141 mmol/L (ref 134–144)
eGFR: 73 mL/min/1.73 (ref >59)

## 2023-12-31 ENCOUNTER — Ambulatory Visit: Payer: Self-pay | Admitting: Family

## 2024-02-04 ENCOUNTER — Ambulatory Visit

## 2024-02-25 ENCOUNTER — Ambulatory Visit (INDEPENDENT_AMBULATORY_CARE_PROVIDER_SITE_OTHER)

## 2024-02-25 VITALS — BP 145/67 | HR 59 | Ht 70.0 in | Wt 202.0 lb

## 2024-02-25 DIAGNOSIS — Z0000 Encounter for general adult medical examination without abnormal findings: Secondary | ICD-10-CM | POA: Diagnosis not present

## 2024-02-25 NOTE — Progress Notes (Signed)
 Subjective:   Howard Ramos is a 83 y.o. who presents for a Medicare Wellness preventive visit.  As a reminder, Annual Wellness Visits don't include a physical exam, and some assessments may be limited, especially if this visit is performed virtually. We may recommend an in-person follow-up visit with your provider if needed.  Visit Complete: Virtual I connected with  Howard Ramos on 02/25/24 by a audio enabled telemedicine application and verified that I am speaking with the correct person using two identifiers.  Patient Location: Home  Provider Location: Home Office  I discussed the limitations of evaluation and management by telemedicine. The patient expressed understanding and agreed to proceed.  Vital Signs: Because this visit was a virtual/telehealth visit, some criteria may be missing or patient reported. Any vitals not documented were not able to be obtained and vitals that have been documented are patient reported.  VideoDeclined- This patient declined Librarian, academic. Therefore the visit was completed with audio only.  Persons Participating in Visit: Patient.  AWV Questionnaire: No: Patient Medicare AWV questionnaire was not completed prior to this visit.  Cardiac Risk Factors include: advanced age (>68men, >13 women);dyslipidemia;male gender     Objective:    Today's Vitals   02/25/24 1334  BP: (!) 145/67  Pulse: (!) 59  Weight: 202 lb (91.6 kg)  Height: 5' 10 (1.778 m)   Body mass index is 28.98 kg/m.     02/25/2024    1:37 PM 10/23/2022    8:22 AM 10/20/2021    8:20 AM 10/19/2020    8:49 AM 10/19/2019    8:29 AM 10/16/2018    8:33 AM 10/23/2013   12:35 PM  Advanced Directives  Does Patient Have a Medical Advance Directive? No No No No No No Patient does not have advance directive;Patient would not like information   Would patient like information on creating a medical advance directive?  No - Patient declined No - Patient declined  No - Patient declined No - Patient declined No - Patient declined    Pre-existing out of facility DNR order (yellow form or pink MOST form)       No      Data saved with a previous flowsheet row definition    Current Medications (verified) No outpatient encounter medications on file as of 02/25/2024.   No facility-administered encounter medications on file as of 02/25/2024.    Allergies (verified) Statins   History: Past Medical History:  Diagnosis Date   Cancer (HCC)    Prostate   Diabetes mellitus without complication (HCC)    elevated hgb A1C- diet controlled   Hyperlipidemia    Prostate cancer [facility])    Past Surgical History:  Procedure Laterality Date   CATARACT EXTRACTION W/PHACO Left 10/29/2013   Procedure: CATARACT EXTRACTION PHACO AND INTRAOCULAR LENS PLACEMENT (IOC);  Surgeon: Cherene Mania, MD;  Location: [facility];  Service: Ophthalmology;  Laterality: Left;  CDE:  15.52   CATARACT EXTRACTION W/PHACO Right 12/07/2013   Procedure: CATARACT EXTRACTION PHACO AND INTRAOCULAR LENS PLACEMENT (IOC);  Surgeon: Cherene Mania, MD;  Location: [facility];  Service: Ophthalmology;  Laterality: Right;  CDE 16.99   ROTATOR CUFF REPAIR Right    Family History  Problem Relation Age of Onset   Hypertension Mother    Cancer Father        Pancreatic    Cancer Sister        Lymphoma   Social History   Socioeconomic History   Marital status: Married  Spouse name: Naomie   Number of children: 3   Years of education: 13   Highest education level: Some college, no degree  Occupational History   Occupation: retired    Comment: part-time as curator, painting  Tobacco Use   Smoking status: Former    Current packs/day: 0.00    Average packs/day: 1 pack/day for 40.0 years (40.0 ttl pk-yrs)    Types: Cigarettes    Start date: 10/24/1967    Quit date: 10/24/2007    Years since quitting: 16.3   Smokeless tobacco: Never   Tobacco comments:    He has annual low dose chest CT at [facility] in  [place]  Vaping Use   Vaping status: Never Used  Substance and Sexual Activity   Alcohol use: No   Drug use: No   Sexual activity: Not Currently    Birth control/protection: None  Other Topics Concern   Not on file  Social History Narrative   He owns 23 acres in [place] - orchard, garden, farmland, that he maintains.    Stays very active outdoors, but no specific exercise.    Married for over 50 years.    Goes to [facility] in [place] for vaccines and screenings.   Social Drivers of Corporate Investment Banker Strain: Low Risk  (02/25/2024)   Overall Financial Resource Strain (CARDIA)    Difficulty of Paying Living Expenses: Not hard at all  Food Insecurity: No Food Insecurity (02/25/2024)   Hunger Vital Sign    Worried About Running Out of Food in the Last Year: Never true    Ran Out of Food in the Last Year: Never true  Transportation Needs: No Transportation Needs (02/25/2024)   PRAPARE - Administrator, Civil Service (Medical): No    Lack of Transportation (Non-Medical): No  Physical Activity: Sufficiently Active (02/25/2024)   Exercise Vital Sign    Days of Exercise per Week: 5 days    Minutes of Exercise per Session: 30 min  Stress: No Stress Concern Present (02/25/2024)   [employer] of Occupational Health - Occupational Stress Questionnaire    Feeling of Stress: Not at all  Social Connections: Socially Integrated (02/25/2024)   Social Connection and Isolation Panel    Frequency of Communication with Friends and Family: More than three times a week    Frequency of Social Gatherings with Friends and Family: More than three times a week    Attends Religious Services: More than 4 times per year    Active Member of [employer] or Organizations: Yes    Attends Engineer, Structural: More than 4 times per year    Marital Status: Married    Tobacco Counseling Counseling given: Yes Tobacco comments: He has annual low dose chest CT at [facility] in  [place]    Clinical Intake:  Pre-visit preparation completed: Yes  Pain : No/denies pain     BMI - recorded: 28.98 Nutritional Status: BMI 25 -29 Overweight Nutritional Risks: None Diabetes: No  No results found for: HGBA1C   How often do you need to have someone help you when you read instructions, pamphlets, or other written materials from your doctor or pharmacy?: 1 - Never  Interpreter Needed?: No  Information entered by :: alia t/cma   Activities of Daily Living     02/25/2024    1:36 PM  In your present state of health, do you have any difficulty performing the following activities:  Hearing? 0  Vision? 0  Difficulty concentrating  or making decisions? 0  Walking or climbing stairs? 0  Dressing or bathing? 0  Doing errands, shopping? 0  Preparing Food and eating ? N  Using the Toilet? N  In the past six months, have you accidently leaked urine? N  Do you have problems with loss of bowel control? N  Managing your Medications? N  Managing your Finances? N  Housekeeping or managing your Housekeeping? N    Patient Care Team: Lavell Bari LABOR, FNP as PCP - General (Family Medicine) Matilda Senior, MD as Consulting Physician (Urology) Shona Rush, MD (Dermatology)  I have updated your Care Teams any recent Medical Services you may have received from other providers in the past year.     Assessment:   This is a routine wellness examination for Howard Ramos.  Hearing/Vision screen Hearing Screening - Comments:: Pt denies hearing dif Vision Screening - Comments:: Pt wear reading glasses/pt [facility] in [place]/last ov 2025   Goals Addressed             This Visit's Progress    AWV   On track    10/20/2021 AWV Goal: Exercise for General Health  Patient will verbalize understanding of the benefits of increased physical activity: Exercising regularly is important. It will improve your overall fitness, flexibility, and endurance. Regular exercise  also will improve your overall health. It can help you control your weight, reduce stress, and improve your bone density. Over the next year, patient will increase physical activity as tolerated with a goal of at least 150 minutes of moderate physical activity per week.  You can tell that you are exercising at a moderate intensity if your heart starts beating faster and you start breathing faster but can still hold a conversation. Moderate-intensity exercise ideas include: Walking 1 mile (1.6 km) in about 15 minutes Biking Hiking Golfing Dancing Water aerobics Patient will verbalize understanding of everyday activities that increase physical activity by providing examples like the following: Yard work, such as: Insurance Underwriter Gardening Washing windows or floors Patient will be able to explain general safety guidelines for exercising:  Before you start a new exercise program, talk with your health care provider. Do not exercise so much that you hurt yourself, feel dizzy, or get very short of breath. Wear comfortable clothes and wear shoes with good support. Drink plenty of water while you exercise to prevent dehydration or heat stroke. Work out until your breathing and your heartbeat get faster.        Depression Screen     02/25/2024    1:38 PM 12/24/2023    9:10 AM 10/23/2022    8:21 AM 10/20/2021    8:20 AM 10/19/2020    8:28 AM 08/05/2020    9:02 AM 10/23/2019   11:42 AM  PHQ 2/9 Scores  PHQ - 2 Score 0 0 0 0 0 0 0  PHQ- 9 Score  0         Fall Risk     02/25/2024    1:36 PM 12/24/2023    9:10 AM 10/23/2022    8:20 AM 10/20/2021    8:18 AM 10/19/2020    8:48 AM  Fall Risk   Falls in the past year? 0 0 0 1 0  Number falls in past yr: 0 0 0 0 0  Injury with Fall? 0 0 0 0 0  Risk for fall due to : No Fall Risks  No Fall Risks No Fall Risks Orthopedic patient;History of fall(s) No Fall Risks   Follow up Falls evaluation completed;Education provided Falls evaluation completed;Follow up appointment;Education provided Falls prevention discussed Falls prevention discussed  Falls prevention discussed      Data saved with a previous flowsheet row definition    MEDICARE RISK AT HOME:  Medicare Risk at Home Any stairs in or around the home?: Yes If so, are there any without handrails?: Yes Home free of loose throw rugs in walkways, pet beds, electrical cords, etc?: Yes Adequate lighting in your home to reduce risk of falls?: Yes Life alert?: No Use of a cane, walker or w/c?: No Grab bars in the bathroom?: Yes Shower chair or bench in shower?: Yes Elevated toilet seat or a handicapped toilet?: No  TIMED UP AND GO:  Was the test performed?  no  Cognitive Function: 6CIT completed        02/25/2024    1:37 PM 10/23/2022    8:23 AM 10/20/2021    8:21 AM 10/19/2019    8:31 AM 10/16/2018    8:36 AM  6CIT Screen  What Year? 0 points 0 points 0 points 0 points 0 points  What month? 0 points 0 points 0 points 0 points 0 points  What time? 0 points 0 points 0 points 0 points 0 points  Count back from 20 0 points 0 points 0 points 0 points 0 points  Months in reverse 0 points 0 points 0 points 0 points 0 points  Repeat phrase 0 points 0 points 0 points 0 points 0 points  Total Score 0 points 0 points 0 points 0 points 0 points    Immunizations Immunization History  Administered Date(s) Administered   Fluad Quad(high Dose 65+) 03/18/2020   Meningococcal Conjugate 12/28/2015   Meningococcal polysaccharide vaccine (MPSV4) 12/27/2016   Moderna Sars-Covid-2 Vaccination 05/21/2019, 06/18/2019   Pneumococcal Conjugate-13 12/28/2015   Pneumococcal Polysaccharide-23 12/27/2016   Tdap 09/22/2013    Screening Tests Health Maintenance  Topic Date Due   Zoster Vaccines- Shingrix (1 of 2) Never done   COVID-19 Vaccine (3 - Moderna risk series) 07/16/2019   DTaP/Tdap/Td (2 - Td or  Tdap) 09/23/2023   Influenza Vaccine  07/28/2024 (Originally 11/29/2023)   Medicare Annual Wellness (AWV)  02/24/2025   Pneumococcal Vaccine: 50+ Years  Completed   Meningococcal B Vaccine  Aged Out    Health Maintenance Items Addressed: See Nurse Notes at the end of this note  Additional Screening:  Vision Screening: Recommended annual ophthalmology exams for early detection of glaucoma and other disorders of the eye. Is the patient up to date with their annual eye exam?  Yes  Who is the provider or what is the name of the office in which the patient attends annual eye exams? [facility] in [facility]  Dental Screening: Recommended annual dental exams for proper oral hygiene  Community Resource Referral / Chronic Care Management: CRR required this visit?  No   CCM required this visit?  No   Plan:    I have personally reviewed and noted the following in the patient's chart:   Medical and social history Use of alcohol, tobacco or illicit drugs  Current medications and supplements including opioid prescriptions. Patient is not currently taking opioid prescriptions. Functional ability and status Nutritional status Physical activity Advanced directives List of other physicians Hospitalizations, surgeries, and ER visits in previous 12 months Vitals Screenings to include cognitive, depression, and falls Referrals and appointments  In addition,  I have reviewed and discussed with patient certain preventive protocols, quality metrics, and best practice recommendations. A written personalized care plan for preventive services as well as general preventive health recommendations were provided to patient.   Ozie Ned, CMA   02/25/2024   After Visit Summary: (MyChart) Due to this being a telephonic visit, the after visit summary with patients personalized plan was offered to patient via MyChart   Notes: Nothing significant to report at this time.

## 2024-02-25 NOTE — Patient Instructions (Signed)
 Howard Ramos,  Thank you for taking the time for your Medicare Wellness Visit. I appreciate your continued commitment to your health goals. Please review the care plan we discussed, and feel free to reach out if I can assist you further.  Medicare recommends these wellness visits once per year to help you and your care team stay ahead of potential health issues. These visits are designed to focus on prevention, allowing your provider to concentrate on managing your acute and chronic conditions during your regular appointments.  Please note that Annual Wellness Visits do not include a physical exam. Some assessments may be limited, especially if the visit was conducted virtually. If needed, we may recommend a separate in-person follow-up with your provider.  Ongoing Care Seeing your primary care provider every 3 to 6 months helps us  monitor your health and provide consistent, personalized care.   Referrals If a referral was made during today's visit and you haven't received any updates within two weeks, please contact the referred provider directly to check on the status.  Recommended Screenings:  Health Maintenance  Topic Date Due   Zoster (Shingles) Vaccine (1 of 2) Never done   COVID-19 Vaccine (3 - Moderna risk series) 07/16/2019   DTaP/Tdap/Td vaccine (2 - Td or Tdap) 09/23/2023   Medicare Annual Wellness Visit  10/23/2023   Flu Shot  07/28/2024*   Pneumococcal Vaccine for age over 19  Completed   Meningitis B Vaccine  Aged Out  *Topic was postponed. The date shown is not the original due date.       02/25/2024    1:37 PM  Advanced Directives  Does Patient Have a Medical Advance Directive? No   Advance Care Planning is important because it: Ensures you receive medical care that aligns with your values, goals, and preferences. Provides guidance to your family and loved ones, reducing the emotional burden of decision-making during critical moments.  Vision: Annual vision  screenings are recommended for early detection of glaucoma, cataracts, and diabetic retinopathy. These exams can also reveal signs of chronic conditions such as diabetes and high blood pressure.  Dental: Annual dental screenings help detect early signs of oral cancer, gum disease, and other conditions linked to overall health, including heart disease and diabetes.  Please see the attached documents for additional preventive care recommendations.

## 2024-05-08 ENCOUNTER — Other Ambulatory Visit

## 2024-05-09 LAB — PSA: Prostate Specific Ag, Serum: 23.7 ng/mL — ABNORMAL HIGH (ref 0.0–4.0)

## 2024-05-11 ENCOUNTER — Ambulatory Visit: Payer: Self-pay | Admitting: Urology

## 2024-05-17 NOTE — Progress Notes (Unsigned)
 "   Impression/Assessment:  Grade group 2, low-volume prostate cancer, on active surveillance with fairly high PSA density.  His most recent fusion biopsy from last summer revealed very small volume grade group 1 prostate cancer.  PSA disproportionate to volume/grade of cancer  Plan:  I will check PSA today  Continue semiannual follow-up  History of Present Illness:   here today for follow-up of prostate cancer, for continuing active surveillance.     TRUS/Bx on 5.8.2018. At that time, PSA was 5.7, prostatic volume 42 mL, PSA density 0.14. 1/12 cores positive for adenocarcinoma, GS 3+3 pattern. The positive core was at the right base lateral, 10% was involved with cancer.    He decided on active surveillance.    1.14.2020: Most recent PSA 7.2 (1.6.2020). He has had no recent urologic issues. His voiding is still excellent.    6.30.2020: Prostate MRI--volume 35.35 ml. No evidence of trans capsular/seminal vesicle/neurovascular bundle involvement, no evidence of pelvic adenopathy or bony metastatic disease. 2 ROIs--  #1--PIRADS 4 lesion Rt posterolateral peropheral zone 18x11x14 ml.  #2--PIRADS 3 lesion Lt apical posterolateral peripheral zone 8x5x5 ml.    8.17.2020: Fusion TRUS/Bx. Prostate volume 30 ml. 1/12 systematic cores + for PCA--GS 3+4 in 20% of core from Rt apex medial. 6 cores from 2 ROI's all benign.    2.9.2023: MRI of prostate   7 mm peripheral zone nodule in the left posterolateral mid gland shows no significant change, but remains suspicious for high-grade carcinoma. PI-RADS 4 (v2.1): High (clinically significant cancer likely) No evidence of extracapsular extension or pelvic metastatic disease.   3.28.2023: PSA 14.5.  He is seeing the results of both his PSA and MRI.  At this point, he would like to hold off on repeat biopsy.     9.26.2023: PSA 16.5.  3.26.2024: PSA 20.2  4.24.2024: MRI prostate-- FINDINGS: Prostate:   Hazy low T2 signal in the peripheral  zone is nonfocal, likely postinflammatory, and is considered PI-RADS category 2.   Region of interest # 1: PI-RADS category 3 lesion of the left posterolateral peripheral zone the apex with focally reduced T2 signal (image 34 of series 11) corresponding to faint reduced ADC map activity (image 9, series 9). No discernible early enhancement or restriction of diffusion at this time. Region of interest # 2: PI-RADS category 3 lesion of the right anterior transition zone in the mid gland and apex with heterogeneous T2 signal (image 35, series 11) corresponding to reduced ADC map activity and restricted diffusion (image 12 of series 9 and 10). This measures 0.69 cc (1.3 by 1.1 by 0.8 cm). Region of interest # 3: PI-RADS category 3 lesion of the right posterolateral peripheral zone in the mid gland and base, with focally reduced T2 signal (image 26, series 11) corresponding to reduced ADC map activity (image 9, series 9). This measures 0.28 cc (1.5 by 0.5 by 0.4 cm). Volume: 3D volumetric analysis: Prostate volume 34.57 cc (5.3 by 4.0 by 3.4 cm) Transcapsular spread:  Absent Seminal vesicle involvement: Absent Neurovascular bundle involvement: Absent Pelvic adenopathy: Absent Bone metastasis: Absent Other findings: No other significant findings.   IMPRESSION: 1. Three PI-RADS category 3 lesions are present in the peripheral zone and transition zone.  6.26.2024: PSA 20.2. Fusion Bx--Volume 34 mL.  All 12 systematic needle core biopsies were negative for adenocarcinoma.  3 needle core biopsies were taken from each of the 3 regions of interest.  Only 1 region of interest (ROI 3) had 1/3 cores revealing GS 3+3  pattern and 20% of that core.  1.14.2025: Recent PSA 19.6 (10 months ago, 20.2) this is a routine visit for him.  He has had no lower urinary tract symptoms.  7.15.2025: Howard Ramos is here for routine check.  PSA 21.9.   no significant LUTS.  IPSS 5/1.  1.20.2026: PSA 23.7 Past Medical  History:  Diagnosis Date   Cancer (HCC)    Prostate   Diabetes mellitus without complication (HCC)    elevated hgb A1C- diet controlled   Hyperlipidemia    Prostate cancer [facility])     Past Surgical History:  Procedure Laterality Date   CATARACT EXTRACTION W/PHACO Left 10/29/2013   Procedure: CATARACT EXTRACTION PHACO AND INTRAOCULAR LENS PLACEMENT (IOC);  Surgeon: Cherene Mania, MD;  Location: [facility];  Service: Ophthalmology;  Laterality: Left;  CDE:  15.52   CATARACT EXTRACTION W/PHACO Right 12/07/2013   Procedure: CATARACT EXTRACTION PHACO AND INTRAOCULAR LENS PLACEMENT (IOC);  Surgeon: Cherene Mania, MD;  Location: [facility];  Service: Ophthalmology;  Laterality: Right;  CDE 16.99   ROTATOR CUFF REPAIR Right     Home Medications:  Allergies as of 05/19/2024       Reactions   Statins Other (See Comments)   Body aches        Medication List    as of May 17, 2024  8:39 AM   You have not been prescribed any medications.     Allergies:  Allergies  Allergen Reactions   Statins Other (See Comments)    Body aches    Family History  Problem Relation Age of Onset   Hypertension Mother    Cancer Father        Pancreatic    Cancer Sister        Lymphoma    Social History:  reports that he quit smoking about 16 years ago. His smoking use included cigarettes. He started smoking about 56 years ago. He has a 40 pack-year smoking history. He has never used smokeless tobacco. He reports that he does not drink alcohol and does not use drugs.  ROS: A complete review of systems was performed.  All systems are negative except for pertinent findings as noted.  Physical Exam:  Vital signs in last 24 hours: There were no vitals taken for this visit. Constitutional:  Alert and oriented, No acute distress Cardiovascular: Regular rate  Respiratory: Normal respiratory effort GU: Normal anal sphincter tone.  Prostate 40 g, symmetric, nonnodular, nontender. Neurologic: Grossly intact, no  focal deficits Psychiatric: Normal mood and affect  I have reviewed prior pt notes  I have reviewed urinalysis results  I have independently reviewed prior imaging--MRI readings, ultrasound readings  I have reviewed prior PSA and pathology results       "

## 2024-05-19 ENCOUNTER — Ambulatory Visit: Admitting: Urology

## 2024-05-19 VITALS — BP 135/82 | HR 71

## 2024-05-19 DIAGNOSIS — C61 Malignant neoplasm of prostate: Secondary | ICD-10-CM

## 2024-05-19 DIAGNOSIS — N400 Enlarged prostate without lower urinary tract symptoms: Secondary | ICD-10-CM

## 2024-05-19 DIAGNOSIS — N401 Enlarged prostate with lower urinary tract symptoms: Secondary | ICD-10-CM | POA: Diagnosis not present

## 2024-05-19 LAB — URINALYSIS, ROUTINE W REFLEX MICROSCOPIC
Bilirubin, UA: NEGATIVE
Glucose, UA: NEGATIVE
Ketones, UA: NEGATIVE
Leukocytes,UA: NEGATIVE
Nitrite, UA: NEGATIVE
Protein,UA: NEGATIVE
RBC, UA: NEGATIVE
Specific Gravity, UA: 1.01 (ref 1.005–1.030)
Urobilinogen, Ur: 1 mg/dL (ref 0.2–1.0)
pH, UA: 7 (ref 5.0–7.5)

## 2024-06-11 ENCOUNTER — Other Ambulatory Visit (HOSPITAL_COMMUNITY)

## 2024-11-26 ENCOUNTER — Other Ambulatory Visit

## 2024-11-30 ENCOUNTER — Ambulatory Visit: Admitting: Urology

## 2024-12-24 ENCOUNTER — Encounter: Payer: Self-pay | Admitting: Family
